# Patient Record
Sex: Male | Born: 1980 | Race: White | Hispanic: No | Smoking: Never smoker
Health system: Southern US, Community
[De-identification: ages and names within clinical notes are randomized; demographics above are authoritative.]

## PROBLEM LIST (undated history)

## (undated) DIAGNOSIS — T7840XA Allergy, unspecified, initial encounter: Secondary | ICD-10-CM

## (undated) DIAGNOSIS — Z87898 Personal history of other specified conditions: Secondary | ICD-10-CM

## (undated) DIAGNOSIS — K219 Gastro-esophageal reflux disease without esophagitis: Secondary | ICD-10-CM

## (undated) DIAGNOSIS — I499 Cardiac arrhythmia, unspecified: Secondary | ICD-10-CM

## (undated) DIAGNOSIS — J45909 Unspecified asthma, uncomplicated: Secondary | ICD-10-CM

## (undated) DIAGNOSIS — F329 Major depressive disorder, single episode, unspecified: Secondary | ICD-10-CM

## (undated) DIAGNOSIS — F32A Depression, unspecified: Secondary | ICD-10-CM

## (undated) DIAGNOSIS — F419 Anxiety disorder, unspecified: Secondary | ICD-10-CM

## (undated) DIAGNOSIS — K649 Unspecified hemorrhoids: Secondary | ICD-10-CM

## (undated) DIAGNOSIS — I340 Nonrheumatic mitral (valve) insufficiency: Secondary | ICD-10-CM

## (undated) HISTORY — DX: Anxiety disorder, unspecified: F41.9

## (undated) HISTORY — DX: Unspecified hemorrhoids: K64.9

## (undated) HISTORY — DX: Unspecified asthma, uncomplicated: J45.909

## (undated) HISTORY — DX: Allergy, unspecified, initial encounter: T78.40XA

## (undated) HISTORY — PX: UPPER GASTROINTESTINAL ENDOSCOPY: SHX188

## (undated) HISTORY — DX: Personal history of other specified conditions: Z87.898

## (undated) HISTORY — DX: Depression, unspecified: F32.A

## (undated) HISTORY — DX: Major depressive disorder, single episode, unspecified: F32.9

## (undated) HISTORY — DX: Cardiac arrhythmia, unspecified: I49.9

## (undated) HISTORY — DX: Gastro-esophageal reflux disease without esophagitis: K21.9

## (undated) HISTORY — DX: Nonrheumatic mitral (valve) insufficiency: I34.0

---

## 2006-05-13 HISTORY — PX: NASAL SINUS SURGERY: SHX719

## 2010-05-13 DIAGNOSIS — Z87898 Personal history of other specified conditions: Secondary | ICD-10-CM

## 2010-05-13 HISTORY — DX: Personal history of other specified conditions: Z87.898

## 2015-05-14 DIAGNOSIS — I340 Nonrheumatic mitral (valve) insufficiency: Secondary | ICD-10-CM

## 2015-05-14 HISTORY — DX: Nonrheumatic mitral (valve) insufficiency: I34.0

## 2018-07-01 ENCOUNTER — Encounter: Payer: Self-pay | Admitting: Physician Assistant

## 2018-07-01 ENCOUNTER — Ambulatory Visit (INDEPENDENT_AMBULATORY_CARE_PROVIDER_SITE_OTHER): Payer: BLUE CROSS/BLUE SHIELD | Admitting: Physician Assistant

## 2018-07-01 VITALS — BP 118/80 | HR 94 | Temp 98.8°F | Ht 71.0 in | Wt 174.0 lb

## 2018-07-01 DIAGNOSIS — Z87898 Personal history of other specified conditions: Secondary | ICD-10-CM

## 2018-07-01 DIAGNOSIS — Z23 Encounter for immunization: Secondary | ICD-10-CM

## 2018-07-01 DIAGNOSIS — F32A Depression, unspecified: Secondary | ICD-10-CM

## 2018-07-01 DIAGNOSIS — Z136 Encounter for screening for cardiovascular disorders: Secondary | ICD-10-CM | POA: Diagnosis not present

## 2018-07-01 DIAGNOSIS — I340 Nonrheumatic mitral (valve) insufficiency: Secondary | ICD-10-CM | POA: Diagnosis not present

## 2018-07-01 DIAGNOSIS — E559 Vitamin D deficiency, unspecified: Secondary | ICD-10-CM | POA: Diagnosis not present

## 2018-07-01 DIAGNOSIS — Z13220 Encounter for screening for lipoid disorders: Secondary | ICD-10-CM | POA: Diagnosis not present

## 2018-07-01 DIAGNOSIS — J45909 Unspecified asthma, uncomplicated: Secondary | ICD-10-CM | POA: Insufficient documentation

## 2018-07-01 DIAGNOSIS — K219 Gastro-esophageal reflux disease without esophagitis: Secondary | ICD-10-CM

## 2018-07-01 DIAGNOSIS — M2550 Pain in unspecified joint: Secondary | ICD-10-CM

## 2018-07-01 DIAGNOSIS — J4520 Mild intermittent asthma, uncomplicated: Secondary | ICD-10-CM

## 2018-07-01 DIAGNOSIS — F329 Major depressive disorder, single episode, unspecified: Secondary | ICD-10-CM

## 2018-07-01 DIAGNOSIS — Z0001 Encounter for general adult medical examination with abnormal findings: Secondary | ICD-10-CM | POA: Diagnosis not present

## 2018-07-01 LAB — CBC WITH DIFFERENTIAL/PLATELET
Basophils Absolute: 0.1 10*3/uL (ref 0.0–0.1)
Basophils Relative: 0.9 % (ref 0.0–3.0)
Eosinophils Absolute: 0.4 10*3/uL (ref 0.0–0.7)
Eosinophils Relative: 5.5 % — ABNORMAL HIGH (ref 0.0–5.0)
HCT: 48.7 % (ref 39.0–52.0)
Hemoglobin: 16.5 g/dL (ref 13.0–17.0)
LYMPHS PCT: 29.6 % (ref 12.0–46.0)
Lymphs Abs: 2.2 10*3/uL (ref 0.7–4.0)
MCHC: 33.9 g/dL (ref 30.0–36.0)
MCV: 91.5 fl (ref 78.0–100.0)
Monocytes Absolute: 0.7 10*3/uL (ref 0.1–1.0)
Monocytes Relative: 8.7 % (ref 3.0–12.0)
NEUTROS ABS: 4.2 10*3/uL (ref 1.4–7.7)
NEUTROS PCT: 55.3 % (ref 43.0–77.0)
PLATELETS: 264 10*3/uL (ref 150.0–400.0)
RBC: 5.32 Mil/uL (ref 4.22–5.81)
RDW: 13 % (ref 11.5–15.5)
WBC: 7.5 10*3/uL (ref 4.0–10.5)

## 2018-07-01 LAB — COMPREHENSIVE METABOLIC PANEL
ALT: 18 U/L (ref 0–53)
AST: 16 U/L (ref 0–37)
Albumin: 4.6 g/dL (ref 3.5–5.2)
Alkaline Phosphatase: 48 U/L (ref 39–117)
BUN: 14 mg/dL (ref 6–23)
CO2: 29 meq/L (ref 19–32)
Calcium: 9.9 mg/dL (ref 8.4–10.5)
Chloride: 103 mEq/L (ref 96–112)
Creatinine, Ser: 1.15 mg/dL (ref 0.40–1.50)
GFR: 70.62 mL/min (ref >60.00)
Glucose, Bld: 75 mg/dL (ref 70–99)
Potassium: 4.2 mEq/L (ref 3.5–5.1)
Sodium: 140 mEq/L (ref 135–145)
Total Bilirubin: 1.2 mg/dL (ref 0.2–1.2)
Total Protein: 7 g/dL (ref 6.0–8.3)

## 2018-07-01 LAB — LIPID PANEL
CHOL/HDL RATIO: 4
Cholesterol: 196 mg/dL (ref 0–200)
HDL: 54.2 mg/dL (ref >39.00)
LDL Cholesterol: 118 mg/dL — ABNORMAL HIGH (ref 0–99)
NonHDL: 141.33
Triglycerides: 118 mg/dL (ref 0.0–149.0)
VLDL: 23.6 mg/dL (ref 0.0–40.0)

## 2018-07-01 LAB — VITAMIN D 25 HYDROXY (VIT D DEFICIENCY, FRACTURES): VITD: 32.15 ng/mL (ref 30.00–100.00)

## 2018-07-01 NOTE — Patient Instructions (Signed)
It was great to see you!  Please go to the lab for blood work.   Our office will call you with your results unless you have chosen to receive results via MyChart.  If your blood work is normal we will follow-up each year for physicals and as scheduled for chronic medical problems.  If anything is abnormal we will treat accordingly and get you in for a follow-up.  Take care,  Douglas GrayerSamantha  You will be contacted about your cardiology referral.  Please make an appointment here with Dr. Berline Choughigby when you are having joint pain for further evaluation.   Health Maintenance, Male A healthy lifestyle and preventive care is important for your health and wellness. Ask your health care provider about what schedule of regular examinations is right for you. What should I know about weight and diet? Eat a Healthy Diet  Eat plenty of vegetables, fruits, whole grains, low-fat dairy products, and lean protein.  Do not eat a lot of foods high in solid fats, added sugars, or salt.  Maintain a Healthy Weight Regular exercise can help you achieve or maintain a healthy weight. You should:  Do at least 150 minutes of exercise each week. The exercise should increase your heart rate and make you sweat (moderate-intensity exercise).  Do strength-training exercises at least twice a week. Watch Your Levels of Cholesterol and Blood Lipids  Have your blood tested for lipids and cholesterol every 5 years starting at 40 years of age. If you are at high risk for heart disease, you should start having your blood tested when you are 40 years old. You may need to have your cholesterol levels checked more often if: ? Your lipid or cholesterol levels are high. ? You are older than 40 years of age. ? You are at high risk for heart disease. What should I know about cancer screening? Many types of cancers can be detected early and may often be prevented. Lung Cancer  You should be screened every year for lung cancer  if: ? You are a current smoker who has smoked for at least 30 years. ? You are a former smoker who has quit within the past 15 years.  Talk to your health care provider about your screening options, when you should start screening, and how often you should be screened. Colorectal Cancer  Routine colorectal cancer screening usually begins at 40 years of age and should be repeated every 5-10 years until you are 40 years old. You may need to be screened more often if early forms of precancerous polyps or small growths are found. Your health care provider may recommend screening at an earlier age if you have risk factors for colon cancer.  Your health care provider may recommend using home test kits to check for hidden blood in the stool.  A small camera at the end of a tube can be used to examine your colon (sigmoidoscopy or colonoscopy). This checks for the earliest forms of colorectal cancer. Prostate and Testicular Cancer  Depending on your age and overall health, your health care provider may do certain tests to screen for prostate and testicular cancer.  Talk to your health care provider about any symptoms or concerns you have about testicular or prostate cancer. Skin Cancer  Check your skin from head to toe regularly.  Tell your health care provider about any new moles or changes in moles, especially if: ? There is a change in a mole's size, shape, or color. ? You have  a mole that is larger than a pencil eraser.  Always use sunscreen. Apply sunscreen liberally and repeat throughout the day.  Protect yourself by wearing long sleeves, pants, a wide-brimmed hat, and sunglasses when outside. What should I know about heart disease, diabetes, and high blood pressure?  If you are 64-60 years of age, have your blood pressure checked every 3-5 years. If you are 4 years of age or older, have your blood pressure checked every year. You should have your blood pressure measured twice-once when  you are at a hospital or clinic, and once when you are not at a hospital or clinic. Record the average of the two measurements. To check your blood pressure when you are not at a hospital or clinic, you can use: ? An automated blood pressure machine at a pharmacy. ? A home blood pressure monitor.  Talk to your health care provider about your target blood pressure.  If you are between 27-31 years old, ask your health care provider if you should take aspirin to prevent heart disease.  Have regular diabetes screenings by checking your fasting blood sugar level. ? If you are at a normal weight and have a low risk for diabetes, have this test once every three years after the age of 17. ? If you are overweight and have a high risk for diabetes, consider being tested at a younger age or more often.  A one-time screening for abdominal aortic aneurysm (AAA) by ultrasound is recommended for men aged 65-75 years who are current or former smokers. What should I know about preventing infection? Hepatitis B If you have a higher risk for hepatitis B, you should be screened for this virus. Talk with your health care provider to find out if you are at risk for hepatitis B infection. Hepatitis C Blood testing is recommended for:  Everyone born from [birth year] through 1967.  Anyone with known risk factors for hepatitis C. Sexually Transmitted Diseases (STDs)  You should be screened each year for STDs including gonorrhea and chlamydia if: ? You are sexually active and are younger than 40 years of age. ? You are older than 40 years of age and your health care provider tells you that you are at risk for this type of infection. ? Your sexual activity has changed since you were last screened and you are at an increased risk for chlamydia or gonorrhea. Ask your health care provider if you are at risk.  Talk with your health care provider about whether you are at high risk of being infected with HIV. Your health care  provider may recommend a prescription medicine to help prevent HIV infection. What else can I do?  Schedule regular health, dental, and eye exams.  Stay current with your vaccines (immunizations).  Do not use any tobacco products, such as cigarettes, chewing tobacco, and e-cigarettes. If you need help quitting, ask your health care provider.  Limit alcohol intake to no more than 2 drinks per day. One drink equals 12 ounces of beer, 5 ounces of wine, or 1 ounces of hard liquor.  Do not use street drugs.  Do not share needles.  Ask your health care provider for help if you need support or information about quitting drugs.  Tell your health care provider if you often feel depressed.  Tell your health care provider if you have ever been abused or do not feel safe at home. This information is not intended to replace advice given to you by your health  care provider. Make sure you discuss any questions you have with your health care provider. Document Released: 10/26/2007 Document Revised: 12/27/2015 Document Reviewed: 01/31/2015 Elsevier Interactive Patient Education  2019 [employer].

## 2018-07-01 NOTE — Progress Notes (Signed)
Douglas Powell is a 38 y.o. male here to Establish Care.  I acted as a Neurosurgeonscribe for Energy East CorporationSamantha Cristin Penaflor, PA-C Corky Mullonna Orphanos, LPN  History of Present Illness:   Chief Complaint  Patient presents with  . Establish Care  . Joint Pain  . Depression    Acute Concerns: Mitral valve insufficiency // palpitations -- saw a cardiology in 2012 and 2017. Was diagnosed with "normal" arrhythmia -- I do not have these records, possibly having PVC's? He had an echo done as well because sister was diagnosed with aortic aneurysm and he was told that he should be screened. Echo a few years ago showed mitral valve insufficiency. He tells me now that he is wondering if there is something going on with his asthma vs heart as he feels like he is deconditioned -- tried to go running and feels out of shape. Denies chest pain or excessive SOB. Joint pain -- has been going on for a few years. Lasts about 3 days when he does have it. Mostly in his bilateral knees, but has been in ankles and upper extremities. When he has symptoms, he rests. He states that it does limit his activity. He tries to do pilates to see if that will help strengthen his muscles and prevent pain. Denies: unusual rashes, swelling, family or personal hx of autoimmune or joint issues. Depression -- having issues with his mood -- states that because of his intermittent joint pain he can't always work out, which is his one source of enjoyment. Denies SI/HI. Has not been on medications, denies need for medication.  Chronic Issues: Vit D deficiency -- hx of this. Currently not on any supplementation. GERD -- currently on prilosec, overall controlled Asthma -- exercise induced. Currently on advair daily. Uses proair prn.  Health Maintenance: Immunizations -- will give Flu and Tdap today. Colonoscopy -- 2018 -- normal except for internal hemorrhoids Mammogram -- N/A PAP -- N/A Bone Density -- N/A PSA -- N/A Diet -- well balanced, mostly at  home Caffeine intake -- drinks a few cups daily Sleep habits -- overall good, no concerns for sleep apnea Exercise -- as able Weight -- Weight: 174 lb (78.9 kg)  Alcohol -- rare Tobacco -- none  Depression screen [facility] 07/01/2018  Decreased Interest 0  Down, Depressed, Hopeless 1  PHQ - 2 Score 1  Altered sleeping 1  Tired, decreased energy 0  Change in appetite 0  Feeling bad or failure about yourself  1  Trouble concentrating 2  Moving slowly or fidgety/restless 0  Suicidal thoughts 0  PHQ-9 Score 5  Difficult doing work/chores Somewhat difficult    GAD 7 : Generalized Anxiety Score 07/01/2018  Nervous, Anxious, on Edge 1  Control/stop worrying 0  Worry too much - different things 1  Trouble relaxing 1  Restless 0  Easily annoyed or irritable 1  Afraid - awful might happen 0  Total GAD 7 Score 4  Anxiety Difficulty Somewhat difficult     Other providers/specialists: Patient Care Team: Jarold MottoWorley, Dreama Kuna, [place] as PCP - General (Physician Assistant)   Past Medical History:  Diagnosis Date  . Asthma   . Depression   . GERD (gastroesophageal reflux disease)   . Hemorrhoids   . History of palpitations 2012  . Mitral valve insufficiency 2017     Social History   Socioeconomic History  . Marital status: Not on file    Spouse name: Not on file  . Number of children: 4  . Years of  education: Not on file  . Highest education level: Not on file  Occupational History  . Not on file  Social Needs  . Financial resource strain: Not on file  . Food insecurity:    Worry: Not on file    Inability: Not on file  . Transportation needs:    Medical: Not on file    Non-medical: Not on file  Tobacco Use  . Smoking status: Never Smoker  . Smokeless tobacco: Never Used  Substance and Sexual Activity  . Alcohol use: Yes    Comment: rarely  . Drug use: Not Currently  . Sexual activity: Yes    Birth control/protection: None  Lifestyle  . Physical activity:    Days per  week: Not on file    Minutes per session: Not on file  . Stress: Not on file  Relationships  . Social connections:    Talks on phone: Not on file    Gets together: Not on file    Attends religious service: Not on file    Active member of club or organization: Not on file    Attends meetings of clubs or organizations: Not on file    Relationship status: Not on file  . Intimate partner violence:    Fear of current or ex partner: Not on file    Emotionally abused: Not on file    Physically abused: Not on file    Forced sexual activity: Not on file  Other Topics Concern  . Not on file  Social History Narrative   Has four children that live with him, full custody -- ages 52-17   Divorced   Engineer, structural, works from [employer]    Past Surgical History:  Procedure Laterality Date  . NASAL SINUS SURGERY  2008    Family History  Problem Relation Age of Onset  . Diabetes Father   . Heart attack Father   . Heart disease Father   . High Cholesterol Father   . Stroke Father   . Heart disease Sister   . Mental illness Brother   . Breast cancer Neg Hx   . Prostate cancer Neg Hx   . Colon cancer Neg Hx     Allergies  Allergen Reactions  . Aspirin      Current Medications:   Current Outpatient Medications:  .  fluticasone-salmeterol (ADVAIR HFA) 115-21 MCG/ACT inhaler, Inhale 2 puffs into the lungs 2 (two) times daily., Disp: , Rfl:  .  pseudoephedrine-acetaminophen (TYLENOL SINUS) 30-500 MG TABS tablet, Take 1 tablet by mouth every 4 (four) hours as needed., Disp: , Rfl:    Review of Systems:   Review of Systems  Constitutional: Negative.  Negative for chills, fever, malaise/fatigue and weight loss.  HENT: Negative.  Negative for hearing loss, sinus pain and sore throat.   Eyes: Negative.  Negative for blurred vision.  Respiratory: Negative.  Negative for cough and shortness of breath.   Cardiovascular: Negative.  Negative for chest pain, palpitations and leg swelling.   Gastrointestinal: Positive for heartburn. Negative for abdominal pain, constipation, diarrhea, nausea and vomiting.  Genitourinary: Negative.  Negative for dysuria, frequency and urgency.  Musculoskeletal: Negative for back pain, myalgias and neck pain.       Joint pain off and on would like to discuss today.  Skin: Negative.  Negative for itching and rash.  Neurological: Negative.  Negative for dizziness, tingling, seizures, loss of consciousness and headaches.  Endo/Heme/Allergies: Negative.  Negative for polydipsia.  Psychiatric/Behavioral: Negative.  Negative  for depression. The patient is not nervous/anxious.     Vitals:   Vitals:   07/01/18 1035  BP: 118/80  Pulse: 94  Temp: 98.8 F (37.1 C)  TempSrc: Oral  SpO2: 96%  Weight: 174 lb (78.9 kg)  Height: 5\' 11"  (1.803 m)      Body mass index is 24.27 kg/m.  Physical Exam:   Physical Exam Vitals signs and nursing note reviewed.  Constitutional:      General: He is not in acute distress.    Appearance: Normal appearance. He is well-developed. He is not ill-appearing or toxic-appearing.  HENT:     Head: Normocephalic and atraumatic.     Right Ear: Tympanic membrane normal. Tympanic membrane is not erythematous, retracted or bulging.     Left Ear: Tympanic membrane normal. Tympanic membrane is not erythematous, retracted or bulging.     Nose: Nose normal.     Mouth/Throat:     Pharynx: Uvula midline.  Eyes:     General: Lids are normal.     Conjunctiva/sclera: Conjunctivae normal.     Pupils: Pupils are equal, round, and reactive to light.  Neck:     Trachea: Trachea normal.  Cardiovascular:     Rate and Rhythm: Normal rate and regular rhythm.     Pulses: Normal pulses.     Heart sounds: Normal heart sounds, S1 normal and S2 normal.  Pulmonary:     Effort: Pulmonary effort is normal.     Breath sounds: Normal breath sounds.  Abdominal:     General: Bowel sounds are normal.     Tenderness: There is no abdominal  tenderness.  Lymphadenopathy:     Cervical: No cervical adenopathy.  Skin:    General: Skin is warm and dry.  Neurological:     Mental Status: He is alert.  Psychiatric:        Speech: Speech normal.        Behavior: Behavior normal. Behavior is cooperative.        Thought Content: Thought content normal.     No results found for this or any previous visit.  Assessment and Plan:   Douglas Powell was seen today for establish care, joint pain and depression.  Diagnoses and all orders for this visit:  Encounter for general adult medical examination with abnormal findings Today patient counseled on age appropriate routine health concerns for screening and prevention, each reviewed and up to date or declined. Immunizations reviewed and up to date or declined. Labs ordered and reviewed. Risk factors for depression reviewed and negative. Hearing function and visual acuity are intact. ADLs screened and addressed as needed. Functional ability and level of safety reviewed and appropriate. Education, counseling and referrals performed based on assessed risks today. Patient provided with a copy of personalized plan for preventive services.  Nonrheumatic mitral valve regurgitation; History of palpitations Referral to cardiology. -     CBC with Differential/Platelet -     Comprehensive metabolic panel  Gastroesophageal reflux disease, esophagitis presence not specified Continue prilosec. Follow-up if symptoms persist.  Depression, unspecified depression type Denies SI/HI today. Declines intervention at this time. Advised patient follow-up if symptoms worsen.  Mild intermittent asthma without complication Well-controlled presently. Continue scheduled Advair and Proair as needed.  Vitamin D deficiency -     VITAMIN D 25 Hydroxy (Vit-D Deficiency, Fractures)  Encounter for lipid screening for cardiovascular disease -     Lipid panel  Arthralgia, unspecified joint Unclear etiology of symptoms.  Discussed possible autoimmune work-up,  but he declined. He is currently asymptomatic. Will refer to Dr. Berline Choughigby.   Other orders -     Tdap vaccine greater than or equal to 7yo IM -     Flu Vaccine QUAD 6+ mos PF IM (Fluarix Quad PF)  . Reviewed expectations re: course of current medical issues. . Discussed self-management of symptoms. . Outlined signs and symptoms indicating need for more acute intervention. . Patient verbalized understanding and all questions were answered. . See orders for this visit as documented in the electronic medical record. . Patient received an After-Visit Summary.  CMA or LPN served as scribe during this visit. History, Physical, and Plan performed by medical provider. The above documentation has been reviewed and is accurate and complete.  Over 45 minutes were spent face-to-face with the patient during this encounter and >50% of that time was spent on counseling and coordination of care  Jarold MottoSamantha Sayward Horvath, PA-C

## 2018-07-10 ENCOUNTER — Encounter: Payer: Self-pay | Admitting: *Deleted

## 2018-07-13 ENCOUNTER — Other Ambulatory Visit: Payer: Self-pay | Admitting: Physician Assistant

## 2018-07-13 DIAGNOSIS — I340 Nonrheumatic mitral (valve) insufficiency: Secondary | ICD-10-CM

## 2018-07-13 DIAGNOSIS — Z87898 Personal history of other specified conditions: Secondary | ICD-10-CM

## 2018-07-14 ENCOUNTER — Ambulatory Visit (INDEPENDENT_AMBULATORY_CARE_PROVIDER_SITE_OTHER): Payer: BLUE CROSS/BLUE SHIELD | Admitting: Physician Assistant

## 2018-07-14 ENCOUNTER — Encounter: Payer: Self-pay | Admitting: Physician Assistant

## 2018-07-14 VITALS — BP 120/80 | HR 81 | Temp 98.2°F | Ht 71.0 in | Wt 173.5 lb

## 2018-07-14 DIAGNOSIS — R059 Cough, unspecified: Secondary | ICD-10-CM

## 2018-07-14 DIAGNOSIS — R05 Cough: Secondary | ICD-10-CM

## 2018-07-14 MED ORDER — AZITHROMYCIN 250 MG PO TABS: ORAL_TABLET | ORAL | 0 refills | Status: DC

## 2018-07-14 NOTE — Patient Instructions (Signed)
It was great to see you!  Start oral antibiotic.  Push fluids and get plenty of rest. Please return if you are not improving as expected, or if you have high fevers (>101.5) or difficulty swallowing or worsening productive cough.  Call clinic with questions.  I hope you start feeling better soon!

## 2018-07-14 NOTE — Progress Notes (Signed)
Douglas Powell is a 38 y.o. male here for a new problem.  I acted as a Neurosurgeon for [employer], PA-C Corky Mull, LPN  History of Present Illness:   Chief Complaint  Patient presents with  . Cough    Cough  This is a new problem. Episode onset: Started few weeks ago. The problem has been waxing and waning. The problem occurs constantly. The cough is productive of sputum (expectorating green sputum and now today coughed up some blood). Associated symptoms include chills (over the weekend), nasal congestion (green drainage), postnasal drip and a sore throat (over the weekend). Pertinent negatives include no ear congestion, ear pain, fever, headaches or shortness of breath. Nothing aggravates the symptoms. He has tried nothing for the symptoms. His past medical history is significant for asthma. There is no history of bronchitis or pneumonia.   Kids have been sick with cough at home.  Past Medical History:  Diagnosis Date  . Asthma   . Depression   . GERD (gastroesophageal reflux disease)   . Hemorrhoids   . History of palpitations 2012  . Mitral valve insufficiency 2017     Social History   Socioeconomic History  . Marital status: Divorced    Spouse name: Not on file  . Number of children: 4  . Years of education: Not on file  . Highest education level: Not on file  Occupational History  . Not on file  Social Needs  . Financial resource strain: Not on file  . Food insecurity:    Worry: Not on file    Inability: Not on file  . Transportation needs:    Medical: Not on file    Non-medical: Not on file  Tobacco Use  . Smoking status: Never Smoker  . Smokeless tobacco: Never Used  Substance and Sexual Activity  . Alcohol use: Yes    Comment: rarely  . Drug use: Not Currently  . Sexual activity: Yes    Birth control/protection: None  Lifestyle  . Physical activity:    Days per week: Not on file    Minutes per session: Not on file  . Stress: Not on file   Relationships  . Social connections:    Talks on phone: Not on file    Gets together: Not on file    Attends religious service: Not on file    Active member of club or organization: Not on file    Attends meetings of clubs or organizations: Not on file    Relationship status: Not on file  . Intimate partner violence:    Fear of current or ex partner: Not on file    Emotionally abused: Not on file    Physically abused: Not on file    Forced sexual activity: Not on file  Other Topics Concern  . Not on file  Social History Narrative   Has four children that live with him, full custody -- ages 15-17   Divorced   Engineer, structural, works from [employer]    Past Surgical History:  Procedure Laterality Date  . NASAL SINUS SURGERY  2008    Family History  Problem Relation Age of Onset  . Diabetes Father   . Heart attack Father   . Heart disease Father   . High Cholesterol Father   . Stroke Father   . Heart disease Sister   . Mental illness Brother   . Breast cancer Neg Hx   . Prostate cancer Neg Hx   . Colon cancer  Neg Hx     Allergies  Allergen Reactions  . Aspirin Other (See Comments)    Pt can not have more than 325 mg a day.    Current Medications:   Current Outpatient Medications:  .  albuterol (PROVENTIL HFA;VENTOLIN HFA) 108 (90 Base) MCG/ACT inhaler, Inhale 2 puffs into the lungs as needed. , Disp: , Rfl:  .  aspirin 325 MG tablet, Take 325 mg by mouth daily., Disp: , Rfl:  .  fluticasone-salmeterol (ADVAIR HFA) 115-21 MCG/ACT inhaler, Inhale 2 puffs into the lungs 2 (two) times daily., Disp: , Rfl:  .  azithromycin (ZITHROMAX) 250 MG tablet, Take two tablets on day 1, then one tablet daily x 4 days, Disp: 6 tablet, Rfl: 0   Review of Systems:   Review of Systems  Constitutional: Positive for chills (over the weekend). Negative for fever.  HENT: Positive for postnasal drip and sore throat (over the weekend). Negative for ear pain.   Respiratory: Positive for cough.  Negative for shortness of breath.   Neurological: Negative for headaches.    Vitals:   Vitals:   07/14/18 1031  BP: 120/80  Pulse: 81  Temp: 98.2 F (36.8 C)  TempSrc: Oral  SpO2: 97%  Weight: 173 lb 8 oz (78.7 kg)  Height:  (1.803 m)     Body mass index is 24.2 kg/m.  Physical Exam:   Physical Exam Vitals signs and nursing note reviewed.  Constitutional:      General: He is not in acute distress.    Appearance: He is well-developed. He is not ill-appearing or toxic-appearing.  HENT:     Head: Normocephalic and atraumatic.     Right Ear: Tympanic membrane, ear canal and external ear normal. Tympanic membrane is not erythematous, retracted or bulging.     Left Ear: Tympanic membrane, ear canal and external ear normal. Tympanic membrane is not erythematous, retracted or bulging.     Nose: Mucosal edema, congestion and rhinorrhea present. No nasal deformity.     Right Sinus: No maxillary sinus tenderness or frontal sinus tenderness.     Left Sinus: No maxillary sinus tenderness or frontal sinus tenderness.     Mouth/Throat:     Lips: Pink.     Mouth: Mucous membranes are moist.     Pharynx: Uvula midline. Posterior oropharyngeal erythema present.     Tonsils: No tonsillar exudate. Swelling: 0 on the right. 0 on the left.  Eyes:     General: Lids are normal.     Conjunctiva/sclera: Conjunctivae normal.  Neck:     Trachea: Trachea normal.  Cardiovascular:     Rate and Rhythm: Normal rate and regular rhythm.     Heart sounds: Normal heart sounds, S1 normal and S2 normal.  Pulmonary:     Effort: Pulmonary effort is normal.     Breath sounds: Normal breath sounds. No decreased breath sounds, wheezing, rhonchi or rales.  Lymphadenopathy:     Cervical: No cervical adenopathy.  Skin:    General: Skin is warm and dry.  Neurological:     Mental Status: He is alert.  Psychiatric:        Speech: Speech normal.        Behavior: Behavior normal. Behavior is cooperative.        Assessment and Plan:   [name] was seen today for cough.  Diagnoses and all orders for this visit:  Cough  Other orders -     azithromycin (ZITHROMAX) 250 MG tablet; Take  two tablets on day 1, then one tablet daily x 4 days   No red flags on exam. Suspect bronchitis. Will initiate azithromycin per orders. Offered prednisone, but he declined. Discussed taking medications as prescribed. Reviewed return precautions including worsening fever, SOB, worsening cough or other concerns. Push fluids and rest. I recommend that patient follow-up if symptoms worsen or persist despite treatment x 7-10 days, sooner if needed.  . Reviewed expectations re: course of current medical issues. . Discussed self-management of symptoms. . Outlined signs and symptoms indicating need for more acute intervention. . Patient verbalized understanding and all questions were answered. . See orders for this visit as documented in the electronic medical record. . Patient received an After-Visit Summary.  CMA or LPN served as scribe during this visit. History, Physical, and Plan performed by medical provider. The above documentation has been reviewed and is accurate and complete.   Jarold Motto, PA-C

## 2018-07-28 ENCOUNTER — Other Ambulatory Visit: Payer: Self-pay | Admitting: Physician Assistant

## 2018-07-28 ENCOUNTER — Encounter: Payer: Self-pay | Admitting: Physician Assistant

## 2018-07-31 ENCOUNTER — Telehealth: Payer: Self-pay | Admitting: Cardiology

## 2018-07-31 ENCOUNTER — Other Ambulatory Visit: Payer: Self-pay | Admitting: Physician Assistant

## 2018-07-31 NOTE — Telephone Encounter (Signed)
LVM for patient to call office to r/s appt with Dr. Higgins Lions as there has been a change in his schedule.

## 2018-08-03 ENCOUNTER — Other Ambulatory Visit: Payer: Self-pay | Admitting: Physician Assistant

## 2018-08-03 MED ORDER — BUDESONIDE 32 MCG/ACT NA SUSP: 2.0000 | Freq: Two times a day (BID) | NASAL | 3 refills | Status: DC

## 2018-08-04 ENCOUNTER — Ambulatory Visit: Payer: BLUE CROSS/BLUE SHIELD | Admitting: Cardiology

## 2018-08-04 ENCOUNTER — Telehealth: Payer: Self-pay | Admitting: Cardiology

## 2018-08-04 NOTE — Telephone Encounter (Signed)
I tried to call the patient to discuss his symptoms and to schedule either for an in office visit if needed or a telehealth visit.  LMTCB.  We will try again to contact.  He also has our information to call us.

## 2018-08-31 ENCOUNTER — Telehealth: Payer: Self-pay

## 2018-08-31 NOTE — Telephone Encounter (Signed)
Virtual Visit Pre-Appointment Phone Call  Steps For Call:  1. Confirm consent - "In the setting of the current Covid19 crisis, you are scheduled for a (phone or video) visit with your provider on (09/04/18) at (1PM).  Just as we do with many in-office visits, in order for you to participate in this visit, we must obtain consent.  If you'd like, I can send this to your mychart (if signed up) or email for you to review.  Otherwise, I can obtain your verbal consent now.  All virtual visits are billed to your insurance company just like a normal visit would be.  By agreeing to a virtual visit, we'd like you to understand that the technology does not allow for your provider to perform an examination, and thus may limit your provider's ability to fully assess your condition. If your provider identifies any concerns that need to be evaluated in person, we will make arrangements to do so.  Finally, though the technology is pretty good, we cannot assure that it will always work on either your or our end, and in the setting of a video visit, we may have to convert it to a phone-only visit.  In either situation, we cannot ensure that we have a secure connection.  Are you willing to proceed?" STAFF: Did the patient verbally acknowledge consent to telehealth visit? Document YES/NO here: YES  2. Confirm the BEST phone number to call the day of the visit by including in appointment notes  3. Give patient instructions for WebEx/MyChart download to smartphone as below or Doximity/Doxy.me if video visit (depending on what platform provider is using)  4. Advise patient to be prepared with their blood pressure, heart rate, weight, any heart rhythm information, their current medicines, and a piece of paper and pen handy for any instructions they may receive the day of their visit  5. Inform patient they will receive a phone call 15 minutes prior to their appointment time (may be from unknown caller ID) so they should be  prepared to answer  6. Confirm that appointment type is correct in Epic appointment notes (VIDEO vs PHONE)     TELEPHONE CALL NOTE  Douglas Powell has been deemed a candidate for a follow-up tele-health visit to limit community exposure during the Covid-19 pandemic. I spoke with the patient via phone to ensure availability of phone/video source, confirm preferred email & phone number, and discuss instructions and expectations.  I reminded Douglas Powell to be prepared with any vital sign and/or heart rhythm information that could potentially be obtained via home monitoring, at the time of his visit. I reminded Douglas Powell to expect a phone call at the time of his visit if his visit.  [name], CMA 08/31/2018 10:21 AM   INSTRUCTIONS FOR DOWNLOADING THE WEBEX APP TO SMARTPHONE  - If Apple, ask patient to go to [employer] and type in WebEx in the search bar. Download [employer] Meetings, the blue/green circle. If Android, go to [employer] Play Store and type in [employer]Ex in the search bar. The app is free but as with any other app downloads, their phone may require them to verify saved payment information or Apple/Android password.  - The patient does NOT have to create an account. - On the day of the visit, the assist will walk the patient through joining the meeting with the meeting number/password.  INSTRUCTIONS FOR DOWNLOADING THE MYCHART APP TO SMARTPHONE  - The patient must first make sure to  have activated MyChart and know their login information - If Apple, go to [employer] and type in MyChart in the search bar and download the app. If Android, ask patient to go to [employer] and type in [place] in the search bar and download the app. The app is free but as with any other app downloads, their phone may require them to verify saved payment information or Apple/Android password.  - The patient will need to then log into the app with their MyChart username and password, and  select [facility] as their healthcare provider to link the account. When it is time for your visit, go to the MyChart app, find appointments, and click Begin Video Visit. Be sure to Select Allow for your device to access the Microphone and Camera for your visit. You will then be connected, and your provider will be with you shortly.  **If they have any issues connecting, or need assistance please contact MyChart service desk [phone])**  **If using a computer, in order to ensure the best quality for their visit they will need to use either of the following Internet Browsers: [employer], or Google Chrome**  IF USING DOXIMITY or DOXY.ME - The patient will receive a link just prior to their visit, either by text or email (to be determined day of appointment depending on if it's doxy.me or Doximity).     FULL LENGTH CONSENT FOR TELE-HEALTH VISIT   I hereby voluntarily request, consent and authorize [facility] and its employed or contracted physicians, physician assistants, nurse practitioners or other licensed health care professionals (the Practitioner), to provide me with telemedicine health care services (the "Services") as deemed necessary by the treating Practitioner. I acknowledge and consent to receive the Services by the Practitioner via telemedicine. I understand that the telemedicine visit will involve communicating with the Practitioner through live audiovisual communication technology and the disclosure of certain medical information by electronic transmission. I acknowledge that I have been given the opportunity to request an in-person assessment or other available alternative prior to the telemedicine visit and am voluntarily participating in the telemedicine visit.  I understand that I have the right to withhold or withdraw my consent to the use of telemedicine in the course of my care at any time, without affecting my right to future care or treatment, and that  the Practitioner or I may terminate the telemedicine visit at any time. I understand that I have the right to inspect all information obtained and/or recorded in the course of the telemedicine visit and may receive copies of available information for a reasonable fee.  I understand that some of the potential risks of receiving the Services via telemedicine include:  Marland Kitchen Delay or interruption in medical evaluation due to technological equipment failure or disruption; . Information transmitted may not be sufficient (e.g. poor resolution of images) to allow for appropriate medical decision making by the Practitioner; and/or  . In rare instances, security protocols could fail, causing a breach of personal health information.  Furthermore, I acknowledge that it is my responsibility to provide information about my medical history, conditions and care that is complete and accurate to the best of my ability. I acknowledge that Practitioner's advice, recommendations, and/or decision may be based on factors not within their control, such as incomplete or inaccurate data provided by me or distortions of diagnostic images or specimens that may result from electronic transmissions. I understand that the practice of medicine is not an exact science and  that Practitioner makes no warranties or guarantees regarding treatment outcomes. I acknowledge that I will receive a copy of this consent concurrently upon execution via email to the email address I last provided but may also request a printed copy by calling the office of [facility].    I understand that my insurance will be billed for this visit.   I have read or had this consent read to me. . I understand the contents of this consent, which adequately explains the benefits and risks of the Services being provided via telemedicine.  . I have been provided ample opportunity to ask questions regarding this consent and the Services and have had my questions answered to  my satisfaction. . I give my informed consent for the services to be provided through the use of telemedicine in my medical care  By participating in this telemedicine visit I agree to the above.

## 2018-09-03 NOTE — Progress Notes (Signed)
Virtual Visit via Video Note   This visit type was conducted due to national recommendations for restrictions regarding the COVID-19 Pandemic (e.g. social distancing) in an effort to limit this patient's exposure and mitigate transmission in our community.  Due to his co-morbid illnesses, this patient is at least at moderate risk for complications without adequate follow up.  This format is felt to be most appropriate for this patient at this time.  All issues noted in this document were discussed and addressed.  A limited physical exam was performed with this format.  Please refer to the patient's chart for his consent to telehealth for [facility].   Evaluation Performed:  Follow-up visit  Date:  09/04/2018   ID:  Douglas Powell, DOB [birth year], MRN [id number]  Patient Location: Home Provider Location: Home  PCP:  Jarold MottoWorley, [name], PA  Cardiologist:  Rollene RotundaJames Dessa Ledee, MD  Electrophysiologist:  None   Chief Complaint:  PALPITATIONS  History of Present Illness:    Douglas MerlRichard Mangione is a [age] y.o. male with the patient presents for evaluation of palpitations.  He had previously seen cardiology in 2012 and 17.  He had an echo several years ago that apparently demonstrated mitral insufficiency.  I did not have these results in our system.  He said he was evaluated years ago because his sister who was about 40 had an aorta of about 40 mm.  [place] in [place].  She is never had any diagnosis and she reports that this is been stable for years.  Because of this he had an echo and I do not have these results as above.  He was told that his valve was not significant and really did not need follow-up.  He has not had any chest pressure, neck or arm discomfort.  He does not have any PND or orthopnea.  He tries to exercise routinely and does is typically but feels like he gets short of breath with significant exertion like swimming a distance or walking or biking  a distance.  He thinks he has a little more limitation than he should but he seems to have a very good exercise tolerance overall.  He does have palpitations and these are sporadic.  He is learned to live with him.  He does not describe sustained tachyarrhythmias.  He is not having any presyncope or syncope.  He has no chest pressure, neck or arm discomfort.  He has had no weight gain or edema.  The patient does not have symptoms concerning for COVID-19 infection (fever, chills, cough, or new shortness of breath).    Past Medical History:  Diagnosis Date  . Asthma   . Depression   . GERD (gastroesophageal reflux disease)   . Hemorrhoids   . History of palpitations 2012  . Mitral valve insufficiency 2017   Past Surgical History:  Procedure Laterality Date  . NASAL SINUS SURGERY  2008     Prior to Admission medications   Medication Sig Start Date End Date Taking? Authorizing Provider  albuterol (PROVENTIL HFA;VENTOLIN HFA) 108 (90 Base) MCG/ACT inhaler Inhale 2 puffs into the lungs as needed.  05/15/18   [provider]  aspirin 325 MG tablet Take 325 mg by mouth daily.    [provider]  azithromycin (ZITHROMAX) 250 MG tablet Take two tablets on day 1, then one tablet daily x 4 days 07/14/18   Jarold MottoWorley, [name], PA  budesonide (RHINOCORT ALLERGY) 32 MCG/ACT nasal spray Place 2 sprays into  both nostrils 2 (two) times daily. 08/03/18   Jarold Motto, PA  fluticasone-salmeterol (ADVAIR HFA) [id number] MCG/ACT inhaler Inhale 2 puffs into the lungs 2 (two) times daily.    [provider]    Allergies:   Aspirin   Social History   Tobacco Use  . Smoking status: Never Smoker  . Smokeless tobacco: Never Used  Substance Use Topics  . Alcohol use: Yes    Comment: rarely  . Drug use: Not Currently     Family Hx: The patient's family history includes Diabetes in his father; Heart disease in his sister; High Cholesterol in his father; Mental illness in his brother;  Stroke in his father. There is no history of Breast cancer, Prostate cancer, or Colon cancer.  ROS:   Please see the history of present illness.    As stated in the HPI and negative for all other systems.   Prior CV studies:   The following studies were reviewed today:    Labs/Other Tests and Data Reviewed:    EKG:  No ECG reviewed.  Recent Labs: 07/01/2018: ALT 18; BUN 14; Creatinine, Ser 1.15; Hemoglobin 16.5; Platelets 264.0; Potassium 4.2; Sodium 140   Recent Lipid Panel Lab Results  Component Value Date/Time   CHOL 196 07/01/2018 11:26 AM   TRIG 118.0 07/01/2018 11:26 AM   HDL 54.20 07/01/2018 11:26 AM   CHOLHDL 4 07/01/2018 11:26 AM   LDLCALC 118 (H) 07/01/2018 11:26 AM    Wt Readings from Last 3 Encounters:  09/04/18 170 lb (77.1 kg)  07/14/18 173 lb 8 oz (78.7 kg)  07/01/18 174 lb (78.9 kg)     Objective:    Vital Signs:  Ht 6' (1.829 m)   Wt 170 lb (77.1 kg)   BMI 23.06 kg/m    VITAL SIGNS:  reviewed GEN:  no acute distress EYES:  sclerae anicteric, EOMI - Extraocular Movements Intact NEURO:  alert and oriented x 3, no obvious focal deficit PSYCH:  normal affect  ASSESSMENT & PLAN:    PALPITATIONS: The patient has a stable pattern of palpitations.  We had a long discussion about this.  I do not think further change in management or evaluation is warranted.  MITRAL REGURGITATION: I am going to get a copy of the old echo report.  Depending on what this looked like I decide on further evaluation with physical exam plus or minus another echo.  I suspect this was mild as he was told no further work-up was needed.  SOB: Patient describes some mild dyspnea with significant exertion but I would suspect this is not related to structural heart disease.  He is going to continue to work on a slightly increasing exercise regimen and if he gets more short of breath rather than less he would let me know and I might consider a cardiopulmonary stress test.    COVID-19  Education: The signs and symptoms of COVID-19 were discussed with the patient and how to seek care for testing (follow up with PCP or arrange E-visit).  The importance of social distancing was discussed today.  Time:   Today, I have spent 25 minutes with the patient with telehealth technology discussing the above problems.     Medication Adjustments/Labs and Tests Ordered: Current medicines are reviewed at length with the patient today.  Concerns regarding medicines are outlined above.   Tests Ordered: No orders of the defined types were placed in this encounter.   Medication Changes: No orders of the defined types were  placed in this encounter.   Disposition:  Follow up prn  Signed, Rollene Rotunda, MD  09/04/2018 4:55 PM    [facility]

## 2018-09-04 ENCOUNTER — Encounter: Payer: Self-pay | Admitting: Cardiology

## 2018-09-04 ENCOUNTER — Telehealth: Payer: Self-pay | Admitting: Cardiology

## 2018-09-04 ENCOUNTER — Telehealth (INDEPENDENT_AMBULATORY_CARE_PROVIDER_SITE_OTHER): Payer: BLUE CROSS/BLUE SHIELD | Admitting: Cardiology

## 2018-09-04 VITALS — Ht 72.0 in | Wt 170.0 lb

## 2018-09-04 DIAGNOSIS — I340 Nonrheumatic mitral (valve) insufficiency: Secondary | ICD-10-CM

## 2018-09-04 DIAGNOSIS — R0602 Shortness of breath: Secondary | ICD-10-CM | POA: Insufficient documentation

## 2018-09-04 DIAGNOSIS — R002 Palpitations: Secondary | ICD-10-CM | POA: Diagnosis not present

## 2018-09-04 NOTE — Patient Instructions (Signed)

## 2018-09-04 NOTE — Telephone Encounter (Signed)
F/U Message          Patient is returning a call for virtual visit link pls call to advise. [phone]

## 2018-10-06 ENCOUNTER — Encounter: Payer: Self-pay | Admitting: Physician Assistant

## 2018-10-07 ENCOUNTER — Other Ambulatory Visit: Payer: Self-pay | Admitting: Physician Assistant

## 2018-10-07 DIAGNOSIS — L821 Other seborrheic keratosis: Secondary | ICD-10-CM

## 2018-12-15 ENCOUNTER — Other Ambulatory Visit: Payer: Self-pay | Admitting: Physician Assistant

## 2019-03-06 ENCOUNTER — Other Ambulatory Visit: Payer: Self-pay | Admitting: Physician Assistant

## 2019-07-07 ENCOUNTER — Other Ambulatory Visit: Payer: Self-pay

## 2019-07-07 ENCOUNTER — Ambulatory Visit (INDEPENDENT_AMBULATORY_CARE_PROVIDER_SITE_OTHER): Payer: Managed Care, Other (non HMO) | Admitting: Physician Assistant

## 2019-07-07 ENCOUNTER — Encounter: Payer: Self-pay | Admitting: Physician Assistant

## 2019-07-07 VITALS — BP 120/80 | HR 67 | Temp 98.1°F | Ht 72.0 in | Wt 175.4 lb

## 2019-07-07 DIAGNOSIS — R1031 Right lower quadrant pain: Secondary | ICD-10-CM

## 2019-07-07 DIAGNOSIS — M25561 Pain in right knee: Secondary | ICD-10-CM

## 2019-07-07 DIAGNOSIS — M79645 Pain in left finger(s): Secondary | ICD-10-CM

## 2019-07-07 DIAGNOSIS — M25562 Pain in left knee: Secondary | ICD-10-CM | POA: Diagnosis not present

## 2019-07-07 DIAGNOSIS — G8929 Other chronic pain: Secondary | ICD-10-CM

## 2019-07-07 DIAGNOSIS — M79644 Pain in right finger(s): Secondary | ICD-10-CM

## 2019-07-07 NOTE — Progress Notes (Signed)
Douglas Powell is a 39 y.o. male here for a new problem.  I acted as a Neurosurgeon for [employer], PA-C Corky Mull, LPN  History of Present Illness:   Chief Complaint  Patient presents with  . Joint Pain    HPI   Joint pain Pt c/o joint pain numerous joints over the past 3 months.  Symptoms started in his right groin.  He states that the area feels tight and then feels like it needs to pop, even if he is able to pop it still has lingering symptoms.  Does not feel a bulge in his groin, and denies any inciting event.  He had 1 incident of left sciatica pain radiates down leg.  He also is experiencing intermittent, bilateral sharp shooting pain at the top of his knees.  He also endorses joint pain at the base of bilateral thumbs.  He spends his time working from home in a chair and also takes care of his 4 children.  He currently is not doing any scheduled exercise.  Denies any family history of rheumatoid arthritis and symptoms are not severe enough for him to take any medications.   Past Medical History:  Diagnosis Date  . Asthma   . Depression   . GERD (gastroesophageal reflux disease)   . Hemorrhoids   . History of palpitations 2012  . Mitral valve insufficiency 2017     Social History   Socioeconomic History  . Marital status: Divorced    Spouse name: Not on file  . Number of children: 4  . Years of education: Not on file  . Highest education level: Not on file  Occupational History  . Not on file  Tobacco Use  . Smoking status: Never Smoker  . Smokeless tobacco: Never Used  Substance and Sexual Activity  . Alcohol use: Yes    Comment: rarely  . Drug use: Not Currently  . Sexual activity: Yes    Birth control/protection: None  Other Topics Concern  . Not on file  Social History Narrative   Has four children that live with him, full custody -- ages 49 -66   Engineer, structural, works from home   Social Determinants of Corporate investment banker Strain:    . Difficulty of Paying Living Expenses: Not on file  Food Insecurity:   . Worried About Programme researcher, broadcasting/film/video in the Last Year: Not on file  . Ran Out of Food in the Last Year: Not on file  Transportation Needs:   . Lack of Transportation (Medical): Not on file  . Lack of Transportation (Non-Medical): Not on file  Physical Activity:   . Days of Exercise per Week: Not on file  . Minutes of Exercise per Session: Not on file  Stress:   . Feeling of Stress : Not on file  Social Connections:   . Frequency of Communication with Friends and Family: Not on file  . Frequency of Social Gatherings with Friends and Family: Not on file  . Attends Religious Services: Not on file  . Active Member of Clubs or Organizations: Not on file  . Attends Banker Meetings: Not on file  . Marital Status: Not on file  Intimate Partner Violence:   . Fear of Current or Ex-Partner: Not on file  . Emotionally Abused: Not on file  . Physically Abused: Not on file  . Sexually Abused: Not on file    Past Surgical History:  Procedure Laterality Date  . NASAL SINUS  SURGERY  2008    Family History  Problem Relation Age of Onset  . Diabetes Father   . High Cholesterol Father   . Stroke Father   . Heart disease Sister        Enlarged aorta  . Mental illness Brother   . Breast cancer Neg Hx   . Prostate cancer Neg Hx   . Colon cancer Neg Hx     Allergies  Allergen Reactions  . Aspirin Other (See Comments)    Pt can not have more than 325 mg a day.    Current Medications:   Current Outpatient Medications:  .  albuterol (PROVENTIL HFA;VENTOLIN HFA) 108 (90 Base) MCG/ACT inhaler, Inhale 2 puffs into the lungs as needed. , Disp: , Rfl:  .  aspirin 325 MG tablet, Take 325 mg by mouth daily., Disp: , Rfl:  .  [employer] BUDESONIDE 32 MCG/ACT nasal spray, PLACE 2 SPRAYS INTO BOTH NOSTRILS 2 (TWO) TIMES DAILY, Disp: 8.43 mL, Rfl: 1 .  fluticasone-salmeterol (ADVAIR HFA) 115-21 MCG/ACT inhaler, Inhale 2  puffs into the lungs 2 (two) times daily., Disp: , Rfl:  .  omeprazole (PRILOSEC OTC) 20 MG tablet, Take 20 mg by mouth as needed., Disp: , Rfl:    Review of Systems:   ROS  Negative unless otherwise specified per HPI.  Vitals:   Vitals:   07/07/19 0854  BP: 120/80  Pulse: 67  Temp: 98.1 F (36.7 C)  TempSrc: Temporal  SpO2: 98%  Weight: 175 lb 6.1 oz (79.6 kg)  Height: 6' (1.829 m)     Body mass index is 23.79 kg/m.  Physical Exam:   Physical Exam Vitals and nursing note reviewed.  Constitutional:      Appearance: He is well-developed.  HENT:     Head: Normocephalic.  Eyes:     Conjunctiva/sclera: Conjunctivae normal.     Pupils: Pupils are equal, round, and reactive to light.  Pulmonary:     Effort: Pulmonary effort is normal.  Musculoskeletal:        General: Normal range of motion.     Cervical back: Normal range of motion.     Comments: Bilateral thumbs: No obvious swelling or deformity.  Normal range of motion and opposition.  Does have positive Finkelstein's on right.  Bilateral knees: No obvious swelling or deformity.  Normal range of motion.  No tenderness to palpation or reproduction of symptoms today.  Right groin: No obvious swelling or deformity.  Normal range of motion. No tenderness to palpation or reproduction of symptoms today.  Skin:    General: Skin is warm and dry.  Neurological:     Mental Status: He is alert and oriented to person, place, and time.  Psychiatric:        Behavior: Behavior normal.        Thought Content: Thought content normal.        Judgment: Judgment normal.     Assessment and Plan:   Douglas Powell was seen today for joint pain.  Diagnoses and all orders for this visit:  Right groin pain Unclear etiology. Differential includes hip flexor strain vs hip impingement vs iliopsoas strain/tendinopathy vs other.  No red flags on exam but will refer to sports medicine for further evaluation and management. -     Ambulatory  referral to Sports Medicine  Chronic pain of both knees Unclear etiology.  No red flags on exam but will refer to sports medicine for further evaluation and management. -  Ambulatory referral to Sports Medicine  Bilateral thumb pain I do suspect that he has some level of CMC arthritis bilaterally, with also possible de Quervain's tenosynovitis and right hand.  Discussed overall care for this including rest, ice, splinting as needed.  May need to go to sports medicine at some point if this is becoming debilitating or bothersome for patient.  . Reviewed expectations re: course of current medical issues. . Discussed self-management of symptoms. . Outlined signs and symptoms indicating need for more acute intervention. . Patient verbalized understanding and all questions were answered. . See orders for this visit as documented in the electronic medical record. . Patient received an After-Visit Summary.  CMA or LPN served as scribe during this visit. History, Physical, and Plan performed by medical provider. The above documentation has been reviewed and is accurate and complete.  Jarold Motto, PA-C

## 2019-07-07 NOTE — Patient Instructions (Signed)
It was great to see you!  A referral has been placed for you to see Dr. Evan Corey with [facility]. Someone from there office will be in touch soon regarding your appointment with him. His location: [facility] [place] [address] on the 1st floor.   Phone number [phone], Fax [phone].  This location is across the street from the entrance to [facility] and in the same complex as the [facility] and [facility]   Take care,  Texanna Hilburn PA-C  

## 2019-07-15 ENCOUNTER — Ambulatory Visit (INDEPENDENT_AMBULATORY_CARE_PROVIDER_SITE_OTHER): Payer: Managed Care, Other (non HMO)

## 2019-07-15 ENCOUNTER — Ambulatory Visit: Payer: Managed Care, Other (non HMO) | Admitting: Family Medicine

## 2019-07-15 ENCOUNTER — Other Ambulatory Visit: Payer: Self-pay

## 2019-07-15 ENCOUNTER — Encounter: Payer: Self-pay | Admitting: Family Medicine

## 2019-07-15 ENCOUNTER — Ambulatory Visit: Payer: Self-pay

## 2019-07-15 VITALS — BP 112/70 | HR 75 | Ht 72.0 in | Wt 175.2 lb

## 2019-07-15 DIAGNOSIS — M7041 Prepatellar bursitis, right knee: Secondary | ICD-10-CM | POA: Insufficient documentation

## 2019-07-15 DIAGNOSIS — M25561 Pain in right knee: Secondary | ICD-10-CM | POA: Diagnosis not present

## 2019-07-15 DIAGNOSIS — M654 Radial styloid tenosynovitis [de Quervain]: Secondary | ICD-10-CM

## 2019-07-15 DIAGNOSIS — M25551 Pain in right hip: Secondary | ICD-10-CM | POA: Diagnosis not present

## 2019-07-15 DIAGNOSIS — G8929 Other chronic pain: Secondary | ICD-10-CM

## 2019-07-15 DIAGNOSIS — M7042 Prepatellar bursitis, left knee: Secondary | ICD-10-CM

## 2019-07-15 DIAGNOSIS — M25562 Pain in left knee: Secondary | ICD-10-CM

## 2019-07-15 NOTE — Patient Instructions (Addendum)
Thank you for coming in today. 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  Plan for home exercise.  If not better let me know and I will arrange for MRI arthrogram.   For knee I think the problem is prepatellar bursitis.  Use voltaren gel up to 4x daily.  Use compression during activity.  Continue eccentric exercises.   For wrist I think it is dequervans tenosynovitis.  Do home exercises.  Use voltaren gel.   Please perform the exercise program that we have prepared for you and gone over in detail on a daily basis.  In addition to the handout you were provided you can access your program through: [email or web address]   Your unique program code is:  [email or web address] and [email or web address]

## 2019-07-15 NOTE — Progress Notes (Signed)
Subjective:    I'm seeing this patient as a consultation for:  Len Blalock, [place]. Note will be routed back to referring provider/PCP.  CC: B knee pain, R groin and B thumb pain  I, Molly Weber, LAT, ATC, am serving as scribe for Dr. Lynne Leader.  HPI: Pt is a 39 y/o male presenting w/ multiple joint pain c/o including B knees, R groin and B thumbs.  Pt states that his issues began w/ R groin pain x few months, reporting that his R groin feels tight like it needs to pop.  He denies any specific MOI.  Aggravating factors include hip ER and IR ROM.  B knee pain: He reports B sharp, shooting pain at the top of his knees that is random in nature. -Swelling: No -Mechanical symptoms: Yes but nothing new -Aggravating factors: Nothing specific but does report increased knee pain w/ increased activity. Location located at superior knees near patellar tendon insertion.  B thumb pain: He reports B thumb pain at the base of both thumbs x one month but notes that this has improved.  Reports increased pain w/ a Finkelstein's test position. -Swelling: No -Mechanical symptoms: No Symptoms are improving on their own.  Diagnostic imaging: None  Past medical history, Surgical history, Family history, Social history, Allergies, and medications have been entered into the medical record, reviewed.   Review of Systems: No new headache, visual changes, nausea, vomiting, diarrhea, constipation, dizziness, abdominal pain, skin rash, fevers, chills, night sweats, weight loss, swollen lymph nodes, body aches, joint swelling, muscle aches, chest pain, shortness of breath, mood changes, visual or auditory hallucinations.   Objective:    Vitals:   07/15/19 1011  BP: 112/70  Pulse: 75  SpO2: 98%   General: Well Developed, well nourished, and in no acute distress.  Neuro/Psych: Alert and oriented x3, extra-ocular muscles intact, able to move all 4 extremities, sensation grossly intact. Skin: Warm and dry, no  rashes noted.  Respiratory: Not using accessory muscles, speaking in full sentences, trachea midline.  Cardiovascular: Pulses palpable, no extremity edema. Abdomen: Does not appear distended. MSK:  Right hip: Normal-appearing. Range of motion: Normal flexion.  Significantly limited internal rotation. Normal external rotation. Nontender. Reproduce pain with flexion and internal rotation.  Left hip: Normal-appearing.  Normal flexion. Internal rotation also limited however approximately 5 to 10 degrees more than right. Normal external rotation. Nontender.  Right knee: Normal-appearing no swelling or deformity. Nontender. Full range of motion. Stable ligamentous exam. Intact strength.  Left knee: Normal-appearing no swelling or deformity. Nontender. Full range of motion. Stable ligaments exam. Intact strength.  Hands and wrist bilaterally normal-appearing nontender normal motion.  Minimally positive Finkelstein's test bilaterally. Normal strength.  Pulses cap refill and sensation are intact.  Lab and Radiology Results  Diagnostic Limited MSK Ultrasound of: Bilateral knees Quad tendon intact normal-appearing no significant joint effusion. Patellar tendon intact.  Slight hypoechoic fluid tracking superficial to patellar tendon both right and left knee. Normal medial lateral joint lines both sides. 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  Cam type femoral stem impingement pattern present.  No significant degenerative changes. Await formal radiology review   Impression and Recommendations:    Assessment and Plan: 39 y.o. male with  Right hip pain/groin pain.  Patient has physical exam significant with pain with hip flexion and internal rotation.  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  However hip flexor  tendinopathy or weakness could also be  a factor here as well.  His pain is mild and not consistent.  Reasonable to try home exercise program first.  If failing that would proceed with MRI arthrogram to further characterize intra-articular hip changes.  Anterior knee pain bilaterally.  Patient has ultrasound exam findings consistent with prepatellar bursitis.  He already is doing some eccentric exercises which I think is a great idea.  Plan to continue those exercises and add Voltaren gel and compressive knee sleeve.  Recheck back if needed.  Wrist pain bilaterally: De Quervain's tenosynovitis.  Discussed exercise program in clinic today with ATC.  Voltaren gel as well recheck back as needed.  This seems to be a much lesser issue.   Orders Placed This Encounter  Procedures  . [place] LIMITED JOINT SPACE STRUCTURES LOW BILAT(NO LINKED CHARGES)    Order Specific Question:   Reason for Exam (SYMPTOM  OR DIAGNOSIS REQUIRED)    Answer:   eval knee pain BL    Order Specific Question:   Preferred imaging location?    Answer:   [facility]  . DG HIP UNILAT WITH PELVIS 2-3 VIEWS RIGHT    Standing Status:   Future    Number of Occurrences:   1    Standing Expiration Date:   09/13/2020    Order Specific Question:   Reason for Exam (SYMPTOM  OR DIAGNOSIS REQUIRED)    Answer:   eval groin and hip pain    Order Specific Question:   Preferred imaging location?    Answer:   [facility]    Order Specific Question:   Radiology Contrast Protocol - do NOT remove file path    Answer:   [email or web address]   No orders of the defined types were placed in this encounter.   Discussed warning signs or symptoms. Please see discharge instructions. Patient expresses understanding.   The above documentation has been reviewed and is accurate and complete Clementeen Graham

## 2019-07-16 NOTE — Progress Notes (Signed)
X-ray hip was read as normal per radiology.  They did not comment on the possibility of impingement.  If not better with physical therapy/home exercise program will proceed with MRI arthrogram.

## 2019-12-12 ENCOUNTER — Encounter: Payer: Self-pay | Admitting: Physician Assistant

## 2020-01-19 ENCOUNTER — Encounter: Payer: Self-pay | Admitting: Physician Assistant

## 2020-01-19 ENCOUNTER — Other Ambulatory Visit: Payer: Self-pay

## 2020-01-19 ENCOUNTER — Ambulatory Visit (INDEPENDENT_AMBULATORY_CARE_PROVIDER_SITE_OTHER): Payer: Managed Care, Other (non HMO) | Admitting: Physician Assistant

## 2020-01-19 VITALS — BP 118/70 | HR 77 | Temp 98.1°F | Ht 72.0 in | Wt 185.0 lb

## 2020-01-19 DIAGNOSIS — K2100 Gastro-esophageal reflux disease with esophagitis, without bleeding: Secondary | ICD-10-CM | POA: Diagnosis not present

## 2020-01-19 MED ORDER — PANTOPRAZOLE SODIUM 40 MG PO TBEC: 40.0000 mg | DELAYED_RELEASE_TABLET | Freq: Every day | ORAL | 1 refills | Status: DC

## 2020-01-19 NOTE — Progress Notes (Signed)
Douglas Powell is a 39 y.o. male here for a follow up of a pre-existing problem.  I acted as a Neurosurgeon for [employer], PA-C Corky Mull, LPN   History of Present Illness:   Chief Complaint  Patient presents with  . Gastroesophageal Reflux    HPI    GERD Pt c/o increase in reflux the past month and has increased his OTC Prilosec to 20 mg BID, increased to BID about a week ago. Denies nausea or vomiting, no diarrhea. Has changed his diet and added more grains and vegetables, eat smaller meals.  At first he thought that this was associated with protein shakes. But after eliminating this he still had issues.  Takes 325 mg ASA daily. Very rare alcohol use.   Never had COVID.   Denies: unintentional weight loss, rectal bleeding, nighttime awakenings of symptoms, vomiting, changes in stools, family history of colon/esophageal cancer, worsening symptoms with activity     Past Medical History:  Diagnosis Date  . Asthma   . Depression   . GERD (gastroesophageal reflux disease)   . Hemorrhoids   . History of palpitations 2012  . Mitral valve insufficiency 2017     Social History   Tobacco Use  . Smoking status: Never Smoker  . Smokeless tobacco: Never Used  Vaping Use  . Vaping Use: Never used  Substance Use Topics  . Alcohol use: Yes    Comment: rarely  . Drug use: Not Currently    Past Surgical History:  Procedure Laterality Date  . NASAL SINUS SURGERY  2008    Family History  Problem Relation Age of Onset  . Diabetes Father   . High Cholesterol Father   . Stroke Father   . Heart disease Sister        Enlarged aorta  . Mental illness Brother   . Breast cancer Neg Hx   . Prostate cancer Neg Hx   . Colon cancer Neg Hx     Allergies  Allergen Reactions  . Aspirin Other (See Comments)    Pt can not have more than 325 mg a day.    Current Medications:   Current Outpatient Medications:  .  albuterol (PROVENTIL HFA;VENTOLIN HFA) 108 (90 Base)  MCG/ACT inhaler, Inhale 2 puffs into the lungs as needed. , Disp: , Rfl:  .  aspirin 325 MG tablet, Take 325 mg by mouth daily., Disp: , Rfl:  .  [employer] BUDESONIDE 32 MCG/ACT nasal spray, PLACE 2 SPRAYS INTO BOTH NOSTRILS 2 (TWO) TIMES DAILY, Disp: 8.43 mL, Rfl: 1 .  fluticasone-salmeterol (ADVAIR HFA) 115-21 MCG/ACT inhaler, Inhale 2 puffs into the lungs 2 (two) times daily., Disp: , Rfl:  .  omeprazole (PRILOSEC OTC) 20 MG tablet, Take 20 mg by mouth as needed., Disp: , Rfl:  .  Olopatadine HCl 0.6 % SOLN, Place 2 sprays into both nostrils daily as needed. , Disp: , Rfl:  .  pantoprazole (PROTONIX) 40 MG tablet, Take 1 tablet (40 mg total) by mouth daily., Disp: 30 tablet, Rfl: 1   Review of Systems:   ROS  Negative unless otherwise specified per HPI.  Vitals:   Vitals:   01/19/20 0835  BP: 118/70  Pulse: 77  Temp: 98.1 F (36.7 C)  TempSrc: Temporal  SpO2: 96%  Weight: 185 lb (83.9 kg)  Height: 6' (1.829 m)     Body mass index is 25.09 kg/m.  Physical Exam:   Physical Exam Vitals and nursing note reviewed.  Constitutional:  General: He is not in acute distress.    Appearance: He is well-developed. He is not ill-appearing or toxic-appearing.  Cardiovascular:     Rate and Rhythm: Normal rate and regular rhythm.     Pulses: Normal pulses.     Heart sounds: Normal heart sounds, S1 normal and S2 normal.     Comments: No LE edema Pulmonary:     Effort: Pulmonary effort is normal.     Breath sounds: Normal breath sounds.  Abdominal:     General: Abdomen is flat. Bowel sounds are normal.     Palpations: Abdomen is soft.     Tenderness: There is no abdominal tenderness.  Skin:    General: Skin is warm and dry.  Neurological:     Mental Status: He is alert.     GCS: GCS eye subscore is 4. GCS verbal subscore is 5. GCS motor subscore is 6.  Psychiatric:        Speech: Speech normal.        Behavior: Behavior normal. Behavior is cooperative.       Assessment and  Plan:   Domnick was seen today for gastroesophageal reflux.  Diagnoses and all orders for this visit:  Gastroesophageal reflux disease with esophagitis, unspecified whether hemorrhage  Other orders -     pantoprazole (PROTONIX) 40 MG tablet; Take 1 tablet (40 mg total) by mouth daily.   Suspect GERD. No red flags on exam or discussion. Will stop prilosec and trial protonix. If no improvement in the next two weeks, will refer to GI.  CMA or LPN served as scribe during this visit. History, Physical, and Plan performed by medical provider. The above documentation has been reviewed and is accurate and complete.   Jarold Motto, PA-C

## 2020-01-19 NOTE — Patient Instructions (Signed)
It was great to see you!  Start Protonix 40 mg daily. This will replace prilosec.  If no improvement in 2 weeks, let me know and we will refer to GI.  FDGard is the supplement you could consider. I don't feel strongly about this -- peppermint is more for IBS.  Take care,  Jarold Motto PA-C

## 2020-01-20 ENCOUNTER — Encounter: Payer: Self-pay | Admitting: Physician Assistant

## 2020-02-07 ENCOUNTER — Other Ambulatory Visit: Payer: Self-pay | Admitting: Physician Assistant

## 2020-02-07 ENCOUNTER — Encounter: Payer: Self-pay | Admitting: Physician Assistant

## 2020-02-07 DIAGNOSIS — K2100 Gastro-esophageal reflux disease with esophagitis, without bleeding: Secondary | ICD-10-CM

## 2020-02-10 ENCOUNTER — Other Ambulatory Visit: Payer: Self-pay | Admitting: Physician Assistant

## 2020-02-24 DIAGNOSIS — J3089 Other allergic rhinitis: Secondary | ICD-10-CM | POA: Insufficient documentation

## 2020-02-24 DIAGNOSIS — J45909 Unspecified asthma, uncomplicated: Secondary | ICD-10-CM | POA: Insufficient documentation

## 2020-03-09 ENCOUNTER — Encounter: Payer: Self-pay | Admitting: Nurse Practitioner

## 2020-03-09 ENCOUNTER — Encounter: Payer: Self-pay | Admitting: Physician Assistant

## 2020-03-16 ENCOUNTER — Other Ambulatory Visit: Payer: Self-pay | Admitting: Physician Assistant

## 2020-03-21 ENCOUNTER — Ambulatory Visit: Payer: Managed Care, Other (non HMO) | Admitting: Nurse Practitioner

## 2020-03-21 ENCOUNTER — Encounter: Payer: Self-pay | Admitting: Nurse Practitioner

## 2020-03-21 VITALS — BP 118/66 | HR 72 | Ht 71.0 in | Wt 188.2 lb

## 2020-03-21 DIAGNOSIS — K219 Gastro-esophageal reflux disease without esophagitis: Secondary | ICD-10-CM

## 2020-03-21 NOTE — Progress Notes (Signed)
____________________________________________________________  Attending physician addendum:  Thank you for sending this case to me. I have reviewed the entire note, and the outlined plan seems appropriate.  Denis Carreon Danis, MD  ____________________________________________________________  

## 2020-03-21 NOTE — Progress Notes (Signed)
03/21/2020 Janeann Merl [id number] 02-Feb-1981   CHIEF COMPLAINT: Reflux symptoms  HISTORY OF PRESENT ILLNESS:  Javarie Crisp is a 39 year old male with a past medical history of depression, asthma and GERD.  S/P sinus surgery 2008. He presents to our office today as referred by Newman Nip, PA-C for further evaluation regarding GERD.  He reports having an history of intermittent GERD symptoms for which he took omeprazole on and off for a few years then stopped taking it 1 or 2 years ago as his symptoms abated.  However, his reflux symptoms recurred after he started drinking protein shakes August 2021.  He developed heartburn with increased burping and abdominal bloat.  He has stopped drinking the protein shakes but he continues to have reflux symptoms.  He was prescribed Protonix 40 mg once daily and initially his symptoms improved but later the Protonix resulted in causing stomach pain.  He stopped taking the Protonix 1 month ago then started taking Zegerid OTC about 1 week ago.  His heartburn and burping symptoms have reduced a little since starting Zegerid.  He denies having any dysphagia.  No upper or lower abdominal pain.  He is passing normal formed brown bowel movements daily.  No rectal bleeding or black stools.  He underwent an EGD in his 30's due to having stomach pain and he was diagnosed with stomach ulcers secondary to ASA use.  He takes Aspirin 325 mg once daily chronically for an inflammatory process primarily involving his sinuses/prior nasal polyps as recommended by his allergist. He is now followed by an immunologist at [facility] with plans to weaning him off of the aspirin.  No other NSAID use.  He previously drank 2 to 3 cups of coffee which he is reduced to less than one half a cup of coffee daily.  He avoids tomato and citrus products.  He no longer eats later at night.  No weight loss.  Father with history of colon polyps.  No other complaints today.  Past Medical  History:  Diagnosis Date  . Asthma   . Depression   . GERD (gastroesophageal reflux disease)   . Hemorrhoids   . History of palpitations 2012  . Mitral valve insufficiency 2017   Past Surgical History:  Procedure Laterality Date  . NASAL SINUS SURGERY  2008   Social History:  He is single.  He has 3 sons and 1 daughter.  He is a Engineer, structural.  Non-smoker.  No alcohol.  No drug use.    Family History:  Father with history of stroke and colon polyps. Mother age 62 healthy.  Sister with heart disease.   Allergies  Allergen Reactions  . Aspirin Other (See Comments)    Pt can not have more than 325 mg a day.      Outpatient Encounter Medications as of 03/21/2020  Medication Sig  . albuterol (PROVENTIL HFA;VENTOLIN HFA) 108 (90 Base) MCG/ACT inhaler Inhale 2 puffs into the lungs as needed.   Marland Kitchen aspirin 325 MG tablet Take 325 mg by mouth daily.  . [employer] BUDESONIDE 32 MCG/ACT nasal spray PLACE 2 SPRAYS INTO BOTH NOSTRILS 2 (TWO) TIMES DAILY  . fluticasone-salmeterol (ADVAIR HFA) 115-21 MCG/ACT inhaler Inhale 2 puffs into the lungs 2 (two) times daily.  . Olopatadine HCl 0.6 % SOLN Place 2 sprays into both nostrils daily as needed.   Marland Kitchen omeprazole (PRILOSEC OTC) 20 MG tablet Take 20 mg by mouth as needed.  . [DISCONTINUED] pantoprazole (PROTONIX) 40 MG  tablet TAKE 1 TABLET BY MOUTH EVERY DAY   No facility-administered encounter medications on file as of 03/21/2020.     REVIEW OF SYSTEMS: Gen: Denies fever, sweats or chills. No weight loss.  CV: Past palpitations thought to be due to stress and caffeine. Patient reported negative cardiac evaluation several years ago. No chest pain. Resp: Denies cough, shortness of breath of hemoptysis.  GI: See HPI. GU : Denies urinary burning, blood in urine, increased urinary frequency or incontinence. MS: Denies joint pain, muscles aches or weakness. Derm: Denies rash, itchiness, skin lesions or unhealing ulcers. Psych: Denies anxiety or  depression. Heme: Denies bruising, bleeding. Neuro:  Denies headaches, dizziness or paresthesias. Endo:  Denies any problems with DM, thyroid or adrenal function.  PHYSICAL EXAM: BP 118/66   Pulse 72   Ht 5\' 11"  (1.803 m)   Wt 188 lb 3.2 oz (85.4 kg)   BMI 26.25 kg/m  General: Well developed 39 year old male in no acute distress. Head: Normocephalic and atraumatic. Eyes:  Sclerae non-icteric, conjunctive pink. Ears: Normal auditory acuity. Mouth: Dentition intact. No ulcers or lesions.  Neck: Supple, no lymphadenopathy or thyromegaly.  Lungs: Clear bilaterally to auscultation without wheezes, crackles or rhonchi. Heart: Regular rate and rhythm. No murmur, rub or gallop appreciated.  Abdomen: Soft, nontender, non distended. No masses. No hepatosplenomegaly. Normoactive bowel sounds x 4 quadrants.  Rectal: Deferred. Musculoskeletal: Symmetrical with no gross deformities. Skin: Warm and dry. No rash or lesions on visible extremities. Extremities: No edema. Neurological: Alert oriented x 4, no focal deficits.  Psychological:  Alert and cooperative. Normal mood and affect.  ASSESSMENT AND PLAN:  47.  39 year old male with GERD.  Prior history of stomach ulcers in his 30s secondary to ASA use. -Continue Zegerid OTC daily for now -GERD handout -EGD benefits and risks discussed including risk with sedation, risk of bleeding, perforation and infection  -Further follow-up to be determined after EGD completed  2. Asthma with aspirin exacerbated respiratory disease, followed by immunologist with plans to wean off ASA. Asthma is stable at this time.   3. Family history of colon polyps -Colonoscopy at age 54, earlier if symptoms warrant           CC:  54, Jarold Motto

## 2020-03-21 NOTE — Patient Instructions (Signed)
You have been scheduled for an endoscopy. Please follow written instructions given to you at your visit today. If you use inhalers (even only as needed), please bring them with you on the day of your procedure.  Continue your over the counter Zegrid daily.  Call our office if your symptoms worsen.  We are giving you a GERD handout to read.   I appreciate the opportunity to care for you. Alcide Evener, CRNP

## 2020-05-16 ENCOUNTER — Encounter: Payer: Self-pay | Admitting: Gastroenterology

## 2020-05-16 ENCOUNTER — Ambulatory Visit (AMBULATORY_SURGERY_CENTER): Payer: Managed Care, Other (non HMO) | Admitting: Gastroenterology

## 2020-05-16 ENCOUNTER — Encounter: Payer: Managed Care, Other (non HMO) | Admitting: Gastroenterology

## 2020-05-16 ENCOUNTER — Other Ambulatory Visit: Payer: Self-pay

## 2020-05-16 VITALS — BP 114/72 | HR 72 | Temp 97.7°F | Resp 10 | Ht 71.0 in | Wt 188.0 lb

## 2020-05-16 DIAGNOSIS — R142 Eructation: Secondary | ICD-10-CM | POA: Diagnosis not present

## 2020-05-16 DIAGNOSIS — K219 Gastro-esophageal reflux disease without esophagitis: Secondary | ICD-10-CM

## 2020-05-16 MED ORDER — SODIUM CHLORIDE 0.9 % IV SOLN: 500.0000 mL | INTRAVENOUS | Status: DC

## 2020-05-16 NOTE — Progress Notes (Signed)
VS-SH 

## 2020-05-16 NOTE — Progress Notes (Signed)
A and O x3. Report to RN. Tolerated MAC anesthesia well.Teeth unchanged after procedure.

## 2020-05-16 NOTE — Patient Instructions (Signed)
Discharge instructions given. ?Normal exam. ?Resume previous medications. ?YOU HAD AN ENDOSCOPIC PROCEDURE TODAY AT THE [facility]:   Refer to the procedure report that was given to you for any specific questions about what was found during the examination.  If the procedure report does not answer your questions, please call your gastroenterologist to clarify.  If you requested that your care partner not be given the details of your procedure findings, then the procedure report has been included in a sealed envelope for you to review at your convenience later. ? ?YOU SHOULD EXPECT: Some feelings of bloating in the abdomen. Passage of more gas than usual.  Walking can help get rid of the air that was put into your GI tract during the procedure and reduce the bloating. If you had a lower endoscopy (such as a colonoscopy or flexible sigmoidoscopy) you may notice spotting of blood in your stool or on the toilet paper. If you underwent a bowel prep for your procedure, you may not have a normal bowel movement for a few days. ? ?Please Note:  You might notice some irritation and congestion in your nose or some drainage.  This is from the oxygen used during your procedure.  There is no need for concern and it should clear up in a day or so. ? ?SYMPTOMS TO REPORT IMMEDIATELY: ? ? ?Following upper endoscopy (EGD) ? Vomiting of blood or coffee ground material ? New chest pain or pain under the shoulder blades ? Painful or persistently difficult swallowing ? New shortness of breath ? Fever of 100?F or higher ? Black, tarry-looking stools ? ?For urgent or emergent issues, a gastroenterologist can be reached at any hour by calling [phone]. ?Do not use MyChart messaging for urgent concerns.  ? ? ?DIET:  We do recommend a small meal at first, but then you may proceed to your regular diet.  Drink plenty of fluids but you should avoid alcoholic beverages for 24 hours. ? ?ACTIVITY:  You should plan to take it easy  for the rest of today and you should NOT DRIVE or use heavy machinery until tomorrow (because of the sedation medicines used during the test).   ? ?FOLLOW UP: ?Our staff will call the number listed on your records 48-72 hours following your procedure to check on you and address any questions or concerns that you may have regarding the information given to you following your procedure. If we do not reach you, we will leave a message.  We will attempt to reach you two times.  During this call, we will ask if you have developed any symptoms of COVID 19. If you develop any symptoms (ie: fever, flu-like symptoms, shortness of breath, cough etc.) before then, please call [phone].  If you test positive for Covid 19 in the 2 weeks post procedure, please call and report this information to us.   ? ?If any biopsies were taken you will be contacted by phone or by letter within the next 1-3 weeks.  Please call us at [phone] if you have not heard about the biopsies in 3 weeks.  ? ? ?SIGNATURES/CONFIDENTIALITY: ?You and/or your care partner have signed paperwork which will be entered into your electronic medical record.  These signatures attest to the fact that that the information above on your After Visit Summary has been reviewed and is understood.  Full responsibility of the confidentiality of this discharge information lies with you and/or your care-partner.  ?

## 2020-05-16 NOTE — Progress Notes (Signed)
Lidocaine buffer  robinol antisialogogue 

## 2020-05-16 NOTE — Op Note (Signed)
[facility] Patient Name: Bharath Bernstein Procedure Date: 05/16/2020 7:58 AM MRN: [id number] Endoscopist: Sherilyn Cooter L. Myrtie Neither , MD Age: 40 Referring MD:  Date of Birth: 06-10-1980 Gender: Male Account #: [id number] Procedure:                Upper GI endoscopy Indications:              Esophageal reflux symptoms that recur despite                            appropriate therapy, Eructation (more than                            heartburn) - no nocturnal symptoms. modest                            improvement with PPI Medicines:                Monitored Anesthesia Care Procedure:                Pre-Anesthesia Assessment:                           - Prior to the procedure, a History and Physical                            was performed, and patient medications and                            allergies were reviewed. The patient's tolerance of                            previous anesthesia was also reviewed. The risks                            and benefits of the procedure and the sedation                            options and risks were discussed with the patient.                            All questions were answered, and informed consent                            was obtained. Prior Anticoagulants: The patient has                            taken no previous anticoagulant or antiplatelet                            agents except for aspirin. ASA Grade Assessment: II                            - A patient with mild systemic disease. After  reviewing the risks and benefits, the patient was                            deemed in satisfactory condition to undergo the                            procedure.                           After obtaining informed consent, the endoscope was                            passed under direct vision. Throughout the                            procedure, the patient's blood pressure, pulse, and                            oxygen  saturations were monitored continuously. The                            Endoscope was introduced through the mouth, and                            advanced to the second part of duodenum. The upper                            GI endoscopy was accomplished without difficulty.                            The patient tolerated the procedure well. Scope In: Scope Out: Findings:                 The esophagus was normal.                           The stomach was normal.                           The cardia and gastric fundus were normal on                            retroflexion.                           The examined duodenum was normal. Complications:            No immediate complications. Estimated Blood Loss:     Estimated blood loss: none. Impression:               - Normal esophagus.                           - Normal stomach.                           - Normal examined duodenum.                           -  No specimens collected.                           Suspected supragastric belching contributing to                            symptoms. Recommendation:           - Patient has a contact number available for                            emergencies. The signs and symptoms of potential                            delayed complications were discussed with the                            patient. Return to normal activities tomorrow.                            Written discharge instructions were provided to the                            patient.                           - Resume previous diet.                           - Continue present medications.                           - Follow an antireflux regimen indefinitely.                            Smaller portions, remain upright for at least an                            hour after meals.                           - Return to GI office PRN. Emmarae Cowdery L. Myrtie Neither, MD 05/16/2020 8:25:47 AM This report has been signed electronically.

## 2020-05-18 ENCOUNTER — Telehealth: Payer: Self-pay

## 2020-05-18 NOTE — Telephone Encounter (Signed)
  Follow up Call-  Call back number 05/16/2020  Post procedure Call Back phone  # [phone]  Permission to leave phone message Yes     Left message

## 2020-05-18 NOTE — Telephone Encounter (Signed)
  Follow up Call-  Call back number 05/16/2020  Post procedure Call Back phone  # [phone]  Permission to leave phone message Yes     Patient questions:  Do you have a fever, pain , or abdominal swelling? No. Pain Score  0 *  Have you tolerated food without any problems? Yes.    Have you been able to return to your normal activities? Yes.    Do you have any questions about your discharge instructions: Diet   No. Medications  No. Follow up visit  No.  Do you have questions or concerns about your Care? No.  Actions: * If pain score is 4 or above: No action needed, pain <4.  1. Have you developed a fever since your procedure? no  2.   Have you had an respiratory symptoms (SOB or cough) since your procedure? no  3.   Have you tested positive for COVID 19 since your procedure no  4.   Have you had any family members/close contacts diagnosed with the COVID 19 since your procedure?  no   If yes to any of these questions please route to Laverna Peace, RN and Karlton Lemon, RN

## 2021-06-27 ENCOUNTER — Other Ambulatory Visit: Payer: Self-pay

## 2021-06-27 ENCOUNTER — Ambulatory Visit: Payer: Managed Care, Other (non HMO) | Admitting: Physician Assistant

## 2021-06-27 ENCOUNTER — Encounter: Payer: Self-pay | Admitting: Physician Assistant

## 2021-06-27 VITALS — BP 110/80 | HR 80 | Temp 98.0°F | Ht 71.0 in | Wt 183.2 lb

## 2021-06-27 DIAGNOSIS — L639 Alopecia areata, unspecified: Secondary | ICD-10-CM

## 2021-06-27 MED ORDER — TRIAMCINOLONE ACETONIDE 0.1 % EX CREA: TOPICAL_CREAM | CUTANEOUS | 0 refills | Status: AC

## 2021-06-27 NOTE — Progress Notes (Signed)
Douglas Powell is a 41 y.o. male here for alopecia.  History of Present Illness:   Chief Complaint  Patient presents with   Alopecia    Pt c/o hair loss on his mustache in the middle x  1 month.    HPI  Alopecia Douglas Powell presents with c/o hair loss in the middle of his mustache which has been onset for several months, becoming more noticeable within the past month. Upon researching his sx, he was informed that he could be experiencing alopecia. As a result, he decided to have this evaluated to make sure nothing more significant is occurring and to see what could be done. Pt has not used any medication for this issue, mainly just shaving down his beard/mustache.   Denies itchiness, flakiness, redness, or recent trauma.     Past Medical History:  Diagnosis Date   Allergy    Anxiety    Arrhythmia    Asthma    Depression    GERD (gastroesophageal reflux disease)    Hemorrhoids    History of palpitations 2012   Mitral valve insufficiency 2017     Social History   Tobacco Use   Smoking status: Never   Smokeless tobacco: Never  Vaping Use   Vaping Use: Never used  Substance Use Topics   Alcohol use: Yes    Comment: rarely   Drug use: Not Currently    Past Surgical History:  Procedure Laterality Date   NASAL SINUS SURGERY  2008   UPPER GASTROINTESTINAL ENDOSCOPY      Family History  Problem Relation Age of Onset   Diabetes Father    High Cholesterol Father    Stroke Father    Colon polyps Father    Heart disease Sister        Enlarged aorta   Mental illness Brother    Breast cancer Neg Hx    Prostate cancer Neg Hx    Colon cancer Neg Hx    Esophageal cancer Neg Hx    Rectal cancer Neg Hx    Stomach cancer Neg Hx     Allergies  Allergen Reactions   Aspirin Other (See Comments)    Pt can not have more than 325 mg a day.    Current Medications:   Current Outpatient Medications:    albuterol (PROVENTIL HFA;VENTOLIN HFA) 108 (90 Base) MCG/ACT inhaler,  Inhale 2 puffs into the lungs as needed. , Disp: , Rfl:    aspirin 325 MG tablet, Take 325 mg by mouth daily., Disp: , Rfl:    Budesonide ER 6 MG CP24, ADD 10ML (ONE DOSE) OF MEDICATION TO 240ML OF SALINE IN SINUS RINSE BOTTLE. IRRIGATE SINUSES WITH 120ML THROUGH EACH NOSTRIL TWICE DAILY, Disp: , Rfl:    fluticasone-salmeterol (ADVAIR HFA) 115-21 MCG/ACT inhaler, Inhale 2 puffs into the lungs 2 (two) times daily., Disp: , Rfl:    triamcinolone cream (KENALOG) 0.1 %, Apply to affected area 1-2 times daily, Disp: 80 g, Rfl: 0   Review of Systems:   ROS Negative unless otherwise specified per HPI. Vitals:   Vitals:   06/27/21 0841  BP: 110/80  Pulse: 80  Temp: 98 F (36.7 C)  TempSrc: Temporal  SpO2: 96%  Weight: 183 lb 4 oz (83.1 kg)  Height: 5\' 11"  (1.803 m)     Body mass index is 25.56 kg/m.  Physical Exam:   Physical Exam Vitals and nursing note reviewed.  Constitutional:      General: He is not in acute distress.  Appearance: He is well-developed. He is not ill-appearing or toxic-appearing.  HENT:     Head:     Comments: Approximately 0.5 cm area of hair loss midline of mustache  No erythema, lesion, or swelling present  Cardiovascular:     Rate and Rhythm: Normal rate and regular rhythm.     Pulses: Normal pulses.     Heart sounds: Normal heart sounds, S1 normal and S2 normal.  Pulmonary:     Effort: Pulmonary effort is normal.     Breath sounds: Normal breath sounds.  Skin:    General: Skin is warm and dry.  Neurological:     Mental Status: He is alert.     GCS: GCS eye subscore is 4. GCS verbal subscore is 5. GCS motor subscore is 6.  Psychiatric:        Speech: Speech normal.        Behavior: Behavior normal. Behavior is cooperative.    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  I, Inda Coke, [place], have reviewed all documentation for this visit. The documentation on 06/27/21 for the exam, diagnosis, procedures, and orders are all accurate and complete.   Inda Coke, PA-C

## 2021-06-27 NOTE — Patient Instructions (Signed)
It was great to see you!  Referral to dermatology placed today  Start topical kenalog ointment 1-2 times daily for 6 weeks  Take care,  Jarold Motto PA-C

## 2021-07-11 ENCOUNTER — Telehealth: Payer: Self-pay | Admitting: Physician Assistant

## 2021-07-11 NOTE — Telephone Encounter (Signed)
Referral attached to appointment

## 2021-07-11 NOTE — Telephone Encounter (Signed)
Patient is calling for a referral appointment from [place], [place].  Patient is scheduled for 02/19/2022 at 9:00 with [facility], PA-C. ?

## 2021-07-31 NOTE — Progress Notes (Signed)
? ?  I, Christoper Fabian, LAT, ATC, am serving as scribe for Dr. Clementeen Graham. ? ?Douglas Powell is a 41 y.o. male who presents to [facility] today for f/u of L knee pain.  He was last seen by Dr. Denyse Amass on 07/15/19 for a multitude of c/o including B knee pain, R groin and B thumb pain.  He was advised to con't eccentric quad strengthening and to use Voltaren gel and a knee sleeve.  Today, pt reports that his L knee pain is intermittent in nature.  He states that he feels like his knee pain is "on the edge."  He notes his knee will feel like it gives way on him at times randomly.  He denies any L knee swelling or mechanical symptoms.  He notes a specific knee ROM that seems to bother him.  His symptoms are very random. ? ? ?Pertinent review of systems: No fevers or chills ? ?Relevant historical information: Asthma ? ? ?Exam:  ?BP 104/70 (BP Location: Right Arm, Patient Position: Sitting, Cuff Size: Normal)   Pulse 80   Ht 5\' 11"  (1.803 m)   Wt 185 lb 3.2 oz (84 kg)   SpO2 95%   BMI 25.83 kg/m?  ?General: Well Developed, well nourished, and in no acute distress.  ? ?MSK: Left knee normal-appearing ?Normal motion. ?Nontender. ?Stable ligamentous exam. ?Negative McMurray's test. ?Intact strength ? ? ? ?Lab and Radiology Results ? ?X-ray images left knee obtained today personally and independently interpreted ?No significant degenerative changes.  No acute fractures.  No loose bodies in the superior patellar space ?Await formal radiology review ? ? ? ? ?Assessment and Plan: ?41 y.o. male with left knee pain and a feeling that his knee will give way at times.  Etiology is unclear.  He could have loose bodies that interfere with quad tendon or the patellofemoral joint.  He could have occasional quad tendinitis that bothers him or he could have patellofemoral chondromalacia that hurts him at times.  We discussed options.  Plan for trial of physical therapy.  If this does not help next step  should probably be MRI. ?Recheck in 8 weeks ? ? ?PDMP not reviewed this encounter. ?Orders Placed This Encounter  ?Procedures  ? DG Knee AP/LAT W/Sunrise Left  ?  Standing Status:   Future  ?  Number of Occurrences:   1  ?  Standing Expiration Date:   09/01/2021  ?  Order Specific Question:   Reason for Exam (SYMPTOM  OR DIAGNOSIS REQUIRED)  ?  Answer:   L knee pain  ?  Order Specific Question:   Preferred imaging location?  ?  Answer:   09/03/2021  ? Ambulatory referral to Physical Therapy  ?  Referral Priority:   Routine  ?  Referral Type:   Physical Medicine  ?  Referral Reason:   Specialty Services Required  ?  Requested Specialty:   Physical Therapy  ?  Number of Visits Requested:   1  ? ?No orders of the defined types were placed in this encounter. ? ? ? ?Discussed warning signs or symptoms. Please see discharge instructions. Patient expresses understanding. ? ? ?The above documentation has been reviewed and is accurate and complete Kyra Searles, M.D. ? ? ?

## 2021-08-01 ENCOUNTER — Other Ambulatory Visit: Payer: Self-pay

## 2021-08-01 ENCOUNTER — Ambulatory Visit (INDEPENDENT_AMBULATORY_CARE_PROVIDER_SITE_OTHER): Payer: Managed Care, Other (non HMO)

## 2021-08-01 ENCOUNTER — Encounter: Payer: Self-pay | Admitting: Family Medicine

## 2021-08-01 ENCOUNTER — Ambulatory Visit (INDEPENDENT_AMBULATORY_CARE_PROVIDER_SITE_OTHER): Payer: Managed Care, Other (non HMO) | Admitting: Family Medicine

## 2021-08-01 VITALS — BP 104/70 | HR 80 | Ht 71.0 in | Wt 185.2 lb

## 2021-08-01 DIAGNOSIS — M25562 Pain in left knee: Secondary | ICD-10-CM | POA: Diagnosis not present

## 2021-08-01 DIAGNOSIS — G8929 Other chronic pain: Secondary | ICD-10-CM

## 2021-08-01 NOTE — Patient Instructions (Addendum)
Good to see you today. ? ?I've referred you to Physical Therapy.  Their office will call you to schedule but please let us know if you haven't heard from them in one week regarding scheduling. ? ?Follow-up: 8 weeks ?

## 2021-08-06 NOTE — Progress Notes (Signed)
Left knee x-ray looks normal to radiology

## 2021-08-29 ENCOUNTER — Ambulatory Visit (HOSPITAL_BASED_OUTPATIENT_CLINIC_OR_DEPARTMENT_OTHER): Payer: Managed Care, Other (non HMO) | Attending: Family Medicine | Admitting: Physical Therapy

## 2021-08-29 ENCOUNTER — Encounter (HOSPITAL_BASED_OUTPATIENT_CLINIC_OR_DEPARTMENT_OTHER): Payer: Self-pay | Admitting: Physical Therapy

## 2021-08-29 DIAGNOSIS — G8929 Other chronic pain: Secondary | ICD-10-CM | POA: Diagnosis not present

## 2021-08-29 DIAGNOSIS — M25562 Pain in left knee: Secondary | ICD-10-CM | POA: Diagnosis present

## 2021-08-29 DIAGNOSIS — R2689 Other abnormalities of gait and mobility: Secondary | ICD-10-CM | POA: Insufficient documentation

## 2021-08-29 NOTE — Therapy (Signed)
?OUTPATIENT PHYSICAL THERAPY LOWER EXTREMITY EVALUATION ? ? ?Patient Name: Douglas Powell ?MRN: [id number] ?DOB:01-29-1981, 41 y.o., male ?Today's Date: 08/30/2021 ? ? PT End of Session - 08/29/21 0858   ? ? Visit Number 1   ? Number of Visits 6   ? Date for PT Re-Evaluation 10/10/21   ? PT Start Time 0845   ? PT Stop Time 0927   ? PT Time Calculation (min) 42 min   ? Activity Tolerance Patient tolerated treatment well   ? Behavior During Therapy [facility] for tasks assessed/performed   ? ?  ?  ? ?  ? ? ?Past Medical History:  ?Diagnosis Date  ? Allergy   ? Anxiety   ? Arrhythmia   ? Asthma   ? Depression   ? GERD (gastroesophageal reflux disease)   ? Hemorrhoids   ? History of palpitations 2012  ? Mitral valve insufficiency 2017  ? ?Past Surgical History:  ?Procedure Laterality Date  ? NASAL SINUS SURGERY  2008  ? UPPER GASTROINTESTINAL ENDOSCOPY    ? ?Patient Active Problem List  ? Diagnosis Date Noted  ? Non-seasonal allergic rhinitis 02/24/2020  ? Aspirin-exacerbated respiratory disease (AERD) 02/24/2020  ? Prepatellar bursitis of both knees 07/15/2019  ? Right hip pain 07/15/2019  ? Palpitations 09/04/2018  ? SOB (shortness of breath) 09/04/2018  ? GERD (gastroesophageal reflux disease)   ? Asthma   ? Mitral valve insufficiency 05/14/2015  ? History of palpitations 05/13/2010  ? ? ?PCP: Jarold Motto, PA ? ?REFERRING PROVIDER: Rodolph Bong, MD ? ?REFERRING DIAG: M25.562,G89.29 (ICD-10-CM) - Chronic pain of left knee ? ?THERAPY DIAG:  ?Chronic pain of left knee ? ?Other abnormalities of gait and mobility ? ?ONSET DATE: On and off for a few years.  ? ?SUBJECTIVE:  ? ?SUBJECTIVE STATEMENT: ?Patient has a history of left knee pain. The pain will come on and last about 3 days and then the pain goes away. When the pain comes it can be intense.  ? ?PERTINENT HISTORY: ?Anxiety, Mitral valve insufficiency,  ? ?PAIN:  ?Are you having pain? Yes: NPRS scale: 0/10 has not had pain for a month or so now  ?Pain location: Left  superior knee ?Pain description: sharp   ?Aggravating factors: could be anything; when it hurts loading it  ?Relieving factors: rest and voltarin  ? ?PRECAUTIONS: None ? ?WEIGHT BEARING RESTRICTIONS No ? ?FALLS:  ?Has patient fallen in last 6 months? No ? ?LIVING ENVIRONMENT: ?Has  half a flight of stairs at his house  ?OCCUPATION: Engineer, structural ? ?PLOF: Independent ? ?PATIENT GOALS to not have these times of increased pain  ? ? ?OBJECTIVE:  ? ?DIAGNOSTIC FINDINGS: X-ray: right negative  ? ?PATIENT SURVEYS:  ?FOTO 30% on intake 60% expected  ? ?COGNITION: ? Overall cognitive status: Within functional limits for tasks assessed   ?  ?SENSATION: ?WFL ? ?MUSCLE LENGTH: ?Mild quad and hamstring tightness  ? ?POSTURE:  ?Bilateral flat foot. Patient is aware  ? ?PALPATION: ?Did not identify any tenderness to palpation  ? ? ?LE ROM: ? ?Full active and passive flexion  ? ?LE MMT: ? ?MMT Right ?08/30/2021 Left ?08/30/2021  ?Hip flexion 53.1 62.0  ?Hip extension    ?Hip abduction 80.7 77  ?Hip adduction    ?Hip internal rotation    ?Hip external rotation    ?Knee flexion    ?Knee extension 81.1 80  ?Ankle dorsiflexion    ?Ankle plantarflexion    ?Ankle inversion    ?Ankle eversion    ? (  Blank rows = not tested) ? ?LOWER EXTREMITY SPECIAL TESTS:  ?Knee special tests: McMurray's test: negative ?Verus/ valgus test negative  ? ? ?FUNCTIONAL TESTS:  ?Single leg stance limited bilateral foot supination with single leg stance. ?Squat: good squat. Keeps good form. No pain  ? ?GAIT: ?Bilateral foot supination to pronation.  ? ? ?TODAY'S TREATMENT: ? ?Exercises ?- Modified Thomas Stretch  - 1 x daily - 7 x weekly - 3 sets - 10 reps ?- The Diver  - 1 x daily - 7 x weekly - 3 sets - 10 reps ?- Side Stepping with Resistance at Thighs  - 1 x daily - 7 x weekly - 3 sets - 10 reps ?- 3-Way Lunge  - 1 x daily - 7 x weekly - 3 sets - 10 reps ? ?Patient also asked about wall sits and hamstring stretches. He was advised both of thos exercises  would be great for him  ? ?PATIENT EDUCATION:  ?Education details: reviewed HEp and symptom management  ?Person educated: Patient ?Education method: Explanation, Demonstration, Tactile cues, Verbal cues, and Handouts ?Education comprehension: verbalized understanding and returned demonstration ? ? ?HOME EXERCISE PROGRAM: ?Access Code: [email or web address] ?URL: [email or web address] ?Date: 08/29/2021 ?Prepared by: Lorayne Bender ? ?Exercises ?- Modified Thomas Stretch  - 1 x daily - 7 x weekly - 3 sets - 10 reps ?- The Diver  - 1 x daily - 7 x weekly - 3 sets - 10 reps ?- Side Stepping with Resistance at Thighs  - 1 x daily - 7 x weekly - 3 sets - 10 reps ?- 3-Way Lunge  - 1 x daily - 7 x weekly - 3 sets - 10 reps ? ?ASSESSMENT: ? ?CLINICAL IMPRESSION: ?Patient is a 41 y.o. male who was seen today for physical therapy evaluation and treatment for left knee pain.  ?His knee goes through episodes of pain. Currently he is not having pain. Therapy was unable to reproduce his pain at this time. An ultrasound showed patellar bursitis at one point. This is likely. His strength was equal L V R. He does have flat foot and supination in single leg stance. He was advised he may want to get an orthotic and a supportive shoe. He was given a thomas stretch for his quad. He was also given low range stability exercises to work on. He will work on his exercises for 2 weeks,then we will assess how his knee is doing and advance his exercise.  ? ?OBJECTIVE IMPAIRMENTS Abnormal gait, decreased activity tolerance, and pain.  ? ?ACTIVITY LIMITATIONS  when he is inflamed stairs and daily activity is limited  .  ? ?PERSONAL FACTORS None ? ? ?REHAB POTENTIAL: Excellent ? ?CLINICAL DECISION MAKING: Stable/uncomplicated ? ?EVALUATION COMPLEXITY: Low ? ? ?GOALS: ?Goals reviewed with patient? Yes ? ?SHORT TERM GOALS: Target date: 09/20/2021 ? ?Patient will obtain shoes that have good support  ?Baseline: ?Goal status: INITIAL ? ?2.  Patient will  improve single leg stance time to 30 seconds without excessive supination of the foot  ?Baseline:  ?Goal status: INITIAL ? ?3.  Patient will go 3 weeks without flair-up  ?Baseline:  ?Goal status: INITIAL ? ? ?LONG TERM GOALS: Target date: 10/11/2021 ? ?Patient will have a full gym program that does not hurt his knee or cause an exacerbation  ?Baseline:  ?Goal status: INITIAL ? ?2.  Patient will go up/down the steps without  ?Baseline:  ?Goal status: INITIAL ? ? ?PLAN: ?PT FREQUENCY: 1x/week ? ?PT DURATION: 6 weeks ? ?  PLANNED INTERVENTIONS: Therapeutic exercises, Therapeutic activity, Neuromuscular re-education, Balance training, Gait training, Patient/Family education, Joint mobilization, Stair training, Dry Needling, Ultrasound, and Manual therapy ? ?PLAN FOR NEXT SESSION: review how the patient did with his exercises. Progress single leg stance activity. Progress banded short movement activity as tolerated.  ? ? ?Dessie Comaavid J Danaysha Kirn, PT ?08/30/2021, 8:19 AM  ?

## 2021-08-30 ENCOUNTER — Encounter (HOSPITAL_BASED_OUTPATIENT_CLINIC_OR_DEPARTMENT_OTHER): Payer: Self-pay | Admitting: Physical Therapy

## 2021-09-13 ENCOUNTER — Ambulatory Visit (HOSPITAL_BASED_OUTPATIENT_CLINIC_OR_DEPARTMENT_OTHER): Payer: Managed Care, Other (non HMO) | Attending: Family Medicine | Admitting: Physical Therapy

## 2021-09-13 ENCOUNTER — Encounter (HOSPITAL_BASED_OUTPATIENT_CLINIC_OR_DEPARTMENT_OTHER): Payer: Self-pay | Admitting: Physical Therapy

## 2021-09-13 DIAGNOSIS — M25562 Pain in left knee: Secondary | ICD-10-CM | POA: Insufficient documentation

## 2021-09-13 DIAGNOSIS — G8929 Other chronic pain: Secondary | ICD-10-CM

## 2021-09-13 DIAGNOSIS — R2689 Other abnormalities of gait and mobility: Secondary | ICD-10-CM | POA: Diagnosis present

## 2021-09-13 NOTE — Therapy (Signed)
?OUTPATIENT PHYSICAL THERAPY LOWER EXTREMITY EVALUATION ? ? ?Patient Name: Douglas Powell ?MRN: [id number] ?DOB:05-25-1980, 41 y.o., male ?Today's Date: 09/13/2021 ? ? PT End of Session - 09/13/21 2039   ? ? Visit Number 2   ? Number of Visits 6   ? Date for PT Re-Evaluation 10/10/21   ? PT Start Time 1230   ? PT Stop Time 1313   ? PT Time Calculation (min) 43 min   ? Activity Tolerance Patient tolerated treatment well   ? Behavior During Therapy [facility] for tasks assessed/performed   ? ?  ?  ? ?  ? ? ? ?Past Medical History:  ?Diagnosis Date  ? Allergy   ? Anxiety   ? Arrhythmia   ? Asthma   ? Depression   ? GERD (gastroesophageal reflux disease)   ? Hemorrhoids   ? History of palpitations 2012  ? Mitral valve insufficiency 2017  ? ?Past Surgical History:  ?Procedure Laterality Date  ? NASAL SINUS SURGERY  2008  ? UPPER GASTROINTESTINAL ENDOSCOPY    ? ?Patient Active Problem List  ? Diagnosis Date Noted  ? Non-seasonal allergic rhinitis 02/24/2020  ? Aspirin-exacerbated respiratory disease (AERD) 02/24/2020  ? Prepatellar bursitis of both knees 07/15/2019  ? Right hip pain 07/15/2019  ? Palpitations 09/04/2018  ? SOB (shortness of breath) 09/04/2018  ? GERD (gastroesophageal reflux disease)   ? Asthma   ? Mitral valve insufficiency 05/14/2015  ? History of palpitations 05/13/2010  ? ? ?PCP: Jarold Motto, PA ? ?REFERRING PROVIDER: Jarold Motto, PA ? ?REFERRING DIAG: M25.562,G89.29 (ICD-10-CM) - Chronic pain of left knee ? ?THERAPY DIAG:  ?Chronic pain of left knee ? ?Other abnormalities of gait and mobility ? ?ONSET DATE: On and off for a few years.  ? ?SUBJECTIVE:  ? ?SUBJECTIVE STATEMENT: ?The patient reports just minor soreness at times. Overall he has been doing well. He is doing well with the exercises.  ?PERTINENT HISTORY: ?Anxiety, Mitral valve insufficiency,  ? ?PAIN:  ?Are you having pain? Yes: NPRS scale: 0/10 has not had pain for a month or so now  ?Pain location: Left superior knee ?Pain description:  sharp   ?Aggravating factors: could be anything; when it hurts loading it  ?Relieving factors: rest and voltarin  ? ?PRECAUTIONS: None ? ?WEIGHT BEARING RESTRICTIONS No ? ?FALLS:  ?Has patient fallen in last 6 months? No ? ?LIVING ENVIRONMENT: ?Has  half a flight of stairs at his house  ?OCCUPATION: Engineer, structural ? ?PLOF: Independent ? ?PATIENT GOALS to not have these times of increased pain  ? ? ?OBJECTIVE:  ? ?DIAGNOSTIC FINDINGS: X-ray: right negative  ? ?PATIENT SURVEYS:  ?FOTO 30% on intake 60% expected  ? ?COGNITION: ? Overall cognitive status: Within functional limits for tasks assessed   ?  ?SENSATION: ?WFL ? ?MUSCLE LENGTH: ?Mild quad and hamstring tightness  ? ?POSTURE:  ?Bilateral flat foot. Patient is aware  ? ?PALPATION: ?Did not identify any tenderness to palpation  ? ? ?LE ROM: ? ?Full active and passive flexion  ? ?LE MMT: ? ?MMT Right ?09/13/2021 Left ?09/13/2021  ?Hip flexion 53.1 62.0  ?Hip extension    ?Hip abduction 80.7 77  ?Hip adduction    ?Hip internal rotation    ?Hip external rotation    ?Knee flexion    ?Knee extension 81.1 80  ?Ankle dorsiflexion    ?Ankle plantarflexion    ?Ankle inversion    ?Ankle eversion    ? (Blank rows = not tested) ? ?LOWER EXTREMITY SPECIAL  TESTS:  ?Knee special tests: McMurray's test: negative ?Verus/ valgus test negative  ? ? ?FUNCTIONAL TESTS:  ?Single leg stance limited bilateral foot supination with single leg stance. ?Squat: good squat. Keeps good form. No pain  ? ?GAIT: ?Bilateral foot supination to pronation.  ? ? ?TODAY'S TREATMENT: ?5/4 ?Star excursions x3 4 way each way ?Single leg diver 2x10  ? ?Banded squat 2x10 blue  ?Monster walk 2x10 blue  ? ?Bridge with band x20  ?Single leg bridge x20  ?Low range SL flutter x20  ? ?Thomas stretch 3x20 sec hold  ? ?LAQ with band 2x10 blue  ? ? ? ? ? ? ? ?Exercises ?- Modified Thomas Stretch  - 1 x daily - 7 x weekly - 3 sets - 10 reps ?- The Diver  - 1 x daily - 7 x weekly - 3 sets - 10 reps ?- Side Stepping with  Resistance at Thighs  - 1 x daily - 7 x weekly - 3 sets - 10 reps ?- 3-Way Lunge  - 1 x daily - 7 x weekly - 3 sets - 10 reps ? ?Patient also asked about wall sits and hamstring stretches. He was advised both of thos exercises would be great for him  ? ?PATIENT EDUCATION:  ?Education details: reviewed HEp and symptom management  ?Person educated: Patient ?Education method: Explanation, Demonstration, Tactile cues, Verbal cues, and Handouts ?Education comprehension: verbalized understanding and returned demonstration ? ? ?HOME EXERCISE PROGRAM: ?Access Code: [email or web address] ?URL: [email or web address] ?Date: 08/29/2021 ?Prepared by: Lorayne Benderavid Rayshaun Needle ? ?Exercises ?- Modified Thomas Stretch  - 1 x daily - 7 x weekly - 3 sets - 10 reps ?- The Diver  - 1 x daily - 7 x weekly - 3 sets - 10 reps ?- Side Stepping with Resistance at Thighs  - 1 x daily - 7 x weekly - 3 sets - 10 reps ?- 3-Way Lunge  - 1 x daily - 7 x weekly - 3 sets - 10 reps ? ?ASSESSMENT: ? ?CLINICAL IMPRESSION: ?The patient tolerated exercises well. We expaned a a few different groups of exercises. He had no significant pain.  We will assess pain next visit. If he is doing well we will leave his case open and have him work on his exercises.  ?OBJECTIVE IMPAIRMENTS Abnormal gait, decreased activity tolerance, and pain.  ? ?ACTIVITY LIMITATIONS  when he is inflamed stairs and daily activity is limited  .  ? ?PERSONAL FACTORS None ? ? ?REHAB POTENTIAL: Excellent ? ?CLINICAL DECISION MAKING: Stable/uncomplicated ? ?EVALUATION COMPLEXITY: Low ? ? ?GOALS: ?Goals reviewed with patient? Yes ? ?SHORT TERM GOALS: Target date: 10/04/2021 ? ?Patient will obtain shoes that have good support  ?Baseline: ?Goal status: INITIAL ? ?2.  Patient will improve single leg stance time to 30 seconds without excessive supination of the foot  ?Baseline:  ?Goal status: INITIAL ? ?3.  Patient will go 3 weeks without flair-up  ?Baseline:  ?Goal status: INITIAL ? ? ?LONG TERM GOALS:  Target date: 10/25/2021 ? ?Patient will have a full gym program that does not hurt his knee or cause an exacerbation  ?Baseline:  ?Goal status: INITIAL ? ?2.  Patient will go up/down the steps without  ?Baseline:  ?Goal status: INITIAL ? ? ?PLAN: ?PT FREQUENCY: 1x/week ? ?PT DURATION: 6 weeks ? ?PLANNED INTERVENTIONS: Therapeutic exercises, Therapeutic activity, Neuromuscular re-education, Balance training, Gait training, Patient/Family education, Joint mobilization, Stair training, Dry Needling, Ultrasound, and Manual therapy ? ?PLAN FOR NEXT SESSION: review how the patient  did with his exercises. Progress single leg stance activity. Progress banded short movement activity as tolerated.  ? ? ?Dessie Coma, PT ?09/13/2021, 8:46 PM  ?

## 2021-09-20 ENCOUNTER — Ambulatory Visit (HOSPITAL_BASED_OUTPATIENT_CLINIC_OR_DEPARTMENT_OTHER): Payer: Managed Care, Other (non HMO) | Admitting: Physical Therapy

## 2021-09-20 ENCOUNTER — Encounter (HOSPITAL_BASED_OUTPATIENT_CLINIC_OR_DEPARTMENT_OTHER): Payer: Self-pay | Admitting: Physical Therapy

## 2021-09-20 DIAGNOSIS — G8929 Other chronic pain: Secondary | ICD-10-CM

## 2021-09-20 DIAGNOSIS — M25562 Pain in left knee: Secondary | ICD-10-CM | POA: Diagnosis not present

## 2021-09-20 DIAGNOSIS — R2689 Other abnormalities of gait and mobility: Secondary | ICD-10-CM

## 2021-09-20 NOTE — Therapy (Addendum)
OUTPATIENT PHYSICAL THERAPY LOWER EXTREMITY EVALUATION/Discharge    Patient Name: Douglas Powell MRN: [id number] DOB:02/03/1981, 41 y.o., male Today's Date: 09/21/2021   PT End of Session - 09/20/21 1152     Visit Number 3    Number of Visits 6    Date for PT Re-Evaluation 10/10/21    PT Start Time 8416    PT Stop Time 1227    PT Time Calculation (min) 42 min    Activity Tolerance Patient tolerated treatment well    Behavior During Therapy [facility] for tasks assessed/performed              Past Medical History:  Diagnosis Date   Allergy    Anxiety    Arrhythmia    Asthma    Depression    GERD (gastroesophageal reflux disease)    Hemorrhoids    History of palpitations 2012   Mitral valve insufficiency 2017   Past Surgical History:  Procedure Laterality Date   NASAL SINUS SURGERY  2008   UPPER GASTROINTESTINAL ENDOSCOPY     Patient Active Problem List   Diagnosis Date Noted   Non-seasonal allergic rhinitis 02/24/2020   Aspirin-exacerbated respiratory disease (AERD) 02/24/2020   Prepatellar bursitis of both knees 07/15/2019   Right hip pain 07/15/2019   Palpitations 09/04/2018   SOB (shortness of breath) 09/04/2018   GERD (gastroesophageal reflux disease)    Asthma    Mitral valve insufficiency 05/14/2015   History of palpitations 05/13/2010    PCP: Inda Coke, PA  REFERRING PROVIDER: Inda Coke, PA  REFERRING DIAG: [phone] (ICD-10-CM) - Chronic pain of left knee  THERAPY DIAG:  Chronic pain of left knee  Other abnormalities of gait and mobility  ONSET DATE: On and off for a few years.   SUBJECTIVE:   SUBJECTIVE STATEMENT: The patient had done very well until 2 days ago. He was doing his band walk when he felt the pull again. The pain lasted for about 2 days. It is better today  PERTINENT HISTORY: Anxiety, Mitral valve insufficiency,   PAIN:  Are you having pain? Yes: NPRS scale: 0/10 has not had pain for a month or so now   Pain location: Left superior knee Pain description: sharp   Aggravating factors: could be anything; when it hurts loading it  Relieving factors: rest and voltarin   PRECAUTIONS: None  WEIGHT BEARING RESTRICTIONS No  FALLS:  Has patient fallen in last 6 months? No  LIVING ENVIRONMENT: Has  half a flight of stairs at his house  OCCUPATION: Building services engineer  PLOF: [place] to not have these times of increased pain    OBJECTIVE:   DIAGNOSTIC FINDINGS: X-ray: right negative   PATIENT SURVEYS:  FOTO 30% on intake 60% expected   COGNITION:  Overall cognitive status: Within functional limits for tasks assessed     SENSATION: WFL  MUSCLE LENGTH: Mild quad and hamstring tightness   POSTURE:  Bilateral flat foot. Patient is aware   PALPATION: Did not identify any tenderness to palpation    LE ROM:  Full active and passive flexion   LE MMT:  MMT Right 09/21/2021 Left 09/21/2021  Hip flexion 53.1 62.0  Hip extension    Hip abduction 80.7 77  Hip adduction    Hip internal rotation    Hip external rotation    Knee flexion    Knee extension 81.1 80  Ankle dorsiflexion    Ankle plantarflexion    Ankle inversion    Ankle eversion     (  Blank rows = not tested)  LOWER EXTREMITY SPECIAL TESTS:  Knee special tests: McMurray's test: negative Verus/ valgus test negative    FUNCTIONAL TESTS:  Single leg stance limited bilateral foot supination with single leg stance. Squat: good squat. Keeps good form. No pain   GAIT: Bilateral foot supination to pronation.    TODAY'S TREATMENT: 5/11 Reviewed HEP; reviewed progression of activity going forward.  Single leg bridge SLR x20 each leg   Manual: looked for trigger points in the lower quad area.   Air-ex: step on and off x20 each leg  Lateral step on and off x20   Low amplitude jump with cuing for sound x30 sec     5/4 Star excursions x3 4 way each way Single leg diver 2x10   Banded  squat 2x10 blue  Monster walk 2x10 blue   Bridge with band x20  Single leg bridge x20  Low range SL flutter x20   Thomas stretch 3x20 sec hold   LAQ with band 2x10 blue         Exercises - Modified Thomas Stretch  - 1 x daily - 7 x weekly - 3 sets - 10 reps - The Diver  - 1 x daily - 7 x weekly - 3 sets - 10 reps - Side Stepping with Resistance at Thighs  - 1 x daily - 7 x weekly - 3 sets - 10 reps - 3-Way Lunge  - 1 x daily - 7 x weekly - 3 sets - 10 reps  Patient also asked about wall sits and hamstring stretches. He was advised both of thos exercises would be great for him   PATIENT EDUCATION:  Education details: reviewed HEp and symptom management  Person educated: Patient Education method: Explanation, Demonstration, Tactile cues, Verbal cues, and Handouts Education comprehension: verbalized understanding and returned demonstration   HOME EXERCISE PROGRAM: Access Code: [email or web address] URL: [email or web address] Date: 08/29/2021 Prepared by: Carolyne Littles  Exercises - Modified Marcello Moores Stretch  - 1 x daily - 7 x weekly - 3 sets - 10 reps - The Diver  - 1 x daily - 7 x weekly - 3 sets - 10 reps - Side Stepping with Resistance at Thighs  - 1 x daily - 7 x weekly - 3 sets - 10 reps - 3-Way Lunge  - 1 x daily - 7 x weekly - 3 sets - 10 reps  ASSESSMENT:  CLINICAL IMPRESSION: Therapy could not find any trigger points in the muscle. He had no soreness with palpation. He could feel it with his instability exercises and a little with the jumping exercise. He was advised to do his exercises for the next few weeks and see if it happens again. It is tough to see if we are making progress because it only happens randomly. He would like to try ju-jitsu. He was advised that at long as it doesn't hirt his knee he should be fine. He will schedule a visit as needed.   OBJECTIVE IMPAIRMENTS Abnormal gait, decreased activity tolerance, and pain.   ACTIVITY LIMITATIONS  when  he is inflamed stairs and daily activity is limited  .   PERSONAL FACTORS None   REHAB POTENTIAL: Excellent  CLINICAL DECISION MAKING: Stable/uncomplicated  EVALUATION COMPLEXITY: Low   GOALS: Goals reviewed with patient? Yes  SHORT TERM GOALS: Target date: 10/12/2021  Patient will obtain shoes that have good support  Baseline: Goal status: INITIAL  2.  Patient will improve single leg stance time to 30 seconds without  excessive supination of the foot  Baseline:  Goal status: INITIAL  3.  Patient will go 3 weeks without flair-up  Baseline:  Goal status: INITIAL   LONG TERM GOALS: Target date: 11/02/2021  Patient will have a full gym program that does not hurt his knee or cause an exacerbation  Baseline:  Goal status: INITIAL  2.  Patient will go up/down the steps without  Baseline:  Goal status: INITIAL   PLAN: PT FREQUENCY: 1x/week  PT DURATION: 6 weeks  PLANNED INTERVENTIONS: Therapeutic exercises, Therapeutic activity, Neuromuscular re-education, Balance training, Gait training, Patient/Family education, Joint mobilization, Stair training, Dry Needling, Ultrasound, and Manual therapy  PLAN FOR NEXT SESSION: review how the patient did with his exercises. Progress single leg stance activity. Progress banded short movement activity as tolerated.   PHYSICAL THERAPY DISCHARGE SUMMARY  Visits from Start of Care: 3  Current functional level related to goals / functional outcomes: Decreased frequency of pain. Has a full HEP    Remaining deficits: Pain at times but less frequent    Education / Equipment: HEP   Patient agrees to discharge. Patient goals were met. Patient is being discharged due to meeting the stated rehab goals.   Carney Living, PT 09/21/2021, 9:13 AM

## 2021-09-21 ENCOUNTER — Encounter (HOSPITAL_BASED_OUTPATIENT_CLINIC_OR_DEPARTMENT_OTHER): Payer: Self-pay | Admitting: Physical Therapy

## 2021-09-25 ENCOUNTER — Encounter (HOSPITAL_BASED_OUTPATIENT_CLINIC_OR_DEPARTMENT_OTHER): Payer: Self-pay

## 2021-09-25 ENCOUNTER — Other Ambulatory Visit: Payer: Self-pay

## 2021-09-25 ENCOUNTER — Emergency Department (HOSPITAL_BASED_OUTPATIENT_CLINIC_OR_DEPARTMENT_OTHER)
Admission: EM | Admit: 2021-09-25 | Discharge: 2021-09-25 | Disposition: A | Discharge status: Home / Self Care | Payer: Managed Care, Other (non HMO) | Attending: Emergency Medicine | Admitting: Emergency Medicine

## 2021-09-25 ENCOUNTER — Emergency Department (HOSPITAL_BASED_OUTPATIENT_CLINIC_OR_DEPARTMENT_OTHER): Payer: Managed Care, Other (non HMO) | Admitting: Radiology

## 2021-09-25 DIAGNOSIS — Z23 Encounter for immunization: Secondary | ICD-10-CM | POA: Insufficient documentation

## 2021-09-25 DIAGNOSIS — Z7951 Long term (current) use of inhaled steroids: Secondary | ICD-10-CM | POA: Insufficient documentation

## 2021-09-25 DIAGNOSIS — S62336A Displaced fracture of neck of fifth metacarpal bone, right hand, initial encounter for closed fracture: Secondary | ICD-10-CM | POA: Insufficient documentation

## 2021-09-25 DIAGNOSIS — Y990 Civilian activity done for income or pay: Secondary | ICD-10-CM | POA: Diagnosis not present

## 2021-09-25 DIAGNOSIS — J45909 Unspecified asthma, uncomplicated: Secondary | ICD-10-CM | POA: Diagnosis not present

## 2021-09-25 DIAGNOSIS — Z7982 Long term (current) use of aspirin: Secondary | ICD-10-CM | POA: Insufficient documentation

## 2021-09-25 DIAGNOSIS — W228XXA Striking against or struck by other objects, initial encounter: Secondary | ICD-10-CM | POA: Diagnosis not present

## 2021-09-25 DIAGNOSIS — S6991XA Unspecified injury of right wrist, hand and finger(s), initial encounter: Secondary | ICD-10-CM | POA: Diagnosis present

## 2021-09-25 DIAGNOSIS — S62339A Displaced fracture of neck of unspecified metacarpal bone, initial encounter for closed fracture: Secondary | ICD-10-CM

## 2021-09-25 MED ORDER — HYDROCODONE-ACETAMINOPHEN 5-325 MG PO TABS: 2.0000 | ORAL_TABLET | ORAL | 0 refills | Status: AC | PRN

## 2021-09-25 MED ORDER — TETANUS-DIPHTH-ACELL PERTUSSIS 5-2.5-18.5 LF-MCG/0.5 IM SUSY
0.5000 mL | PREFILLED_SYRINGE | Freq: Once | INTRAMUSCULAR | Status: AC
  Administered 2021-09-25: 0.5 mL via INTRAMUSCULAR
  Filled 2021-09-25: qty 0.5

## 2021-09-25 NOTE — ED Provider Notes (Signed)
?[facility] EMERGENCY DEPT ?Provider Note ? ? ?CSN: [id number] ?Arrival date & time: 09/25/21  1058 ? ?  ? ?History ?Chief Complaint  ?Patient presents with  ? Hand Injury  ? ? ?Jaycub Noorani is a 41 y.o. male with history of asthma presents the emergency department for evaluation of right hand pain.  Today, the patient was working in and old wood barn and he punched a door thinking that he could demolish it.  He was experiencing pain after that.  He reports he thinks his tetanus was around 10 years ago.  He denies any numbness or tingling to the area.  Denies any previous injury to the hand.  He is right-hand dominant. He currently takes 325mg  of ASA daily.  ? ? ?Hand Injury ? ?  ? ?Home Medications ?Prior to Admission medications   ?Medication Sig Start Date End Date Taking? Authorizing Provider  ?HYDROcodone-acetaminophen (NORCO/VICODIN) 5-325 MG tablet Take 2 tablets by mouth every 4 (four) hours as needed. 09/25/21  Yes 09/27/21, PA-C  ?albuterol (PROVENTIL HFA;VENTOLIN HFA) 108 (90 Base) MCG/ACT inhaler Inhale 2 puffs into the lungs as needed.  05/15/18   [provider]  ?aspirin 325 MG tablet Take 325 mg by mouth daily.    [provider]  ?Budesonide ER 6 MG CP24 ADD 07/14/18 (ONE DOSE) OF MEDICATION TO OF SALINE IN SINUS RINSE BOTTLE. IRRIGATE SINUSES WITH THROUGH EACH NOSTRIL TWICE DAILY 01/14/20   [provider]  ?fluticasone-salmeterol (ADVAIR HFA) 115-21 MCG/ACT inhaler Inhale 2 puffs into the lungs 2 (two) times daily.    [provider]  ?triamcinolone cream (KENALOG) 0.1 % Apply to affected area 1-2 times daily 06/27/21   06/29/21, PA  ?   ? ?Allergies    ?Aspirin   ? ?Review of Systems   ?Review of Systems  ?Musculoskeletal:  Positive for arthralgias and myalgias.  ?Skin:  Positive for wound.  ? ?Physical Exam ?Updated Vital Signs ?BP (!) 140/93   Pulse 68   Temp 97.8 ?F (36.6 ?C) (Oral)   Resp 16   Ht 5\' 11"  (1.803 m)   Wt  83.9 kg   SpO2 98%   BMI 25.80 kg/m?  ?Physical Exam ?Vitals and nursing note reviewed.  ?Constitutional:   ?   General: He is not in acute distress. ?   Appearance: Normal appearance. He is not toxic-appearing.  ?Eyes:  ?   General: No scleral icterus. ?Pulmonary:  ?   Effort: Pulmonary effort is normal. No respiratory distress.  ?Musculoskeletal:     ?   General: Tenderness and signs of injury present.  ?   Comments: Superficial abrasions noted to the PIP of the third and fourth finger.  He does have a very superficial abrasion noted in between the fourth and fifth finger on the dorsal side.  Fracture is proximal to this superficial abrasion. He does have some tenderness to the ulnar side of his hand. No snuffbox tenderness. Cap refill intact. Radial pulse intact. Compartments are soft.   ?Skin: ?   General: Skin is dry.  ?   Findings: No rash.  ?Neurological:  ?   General: No focal deficit present.  ?   Mental Status: He is alert. Mental status is at baseline.  ?Psychiatric:     ?   Mood and Affect: Mood normal.  ? ? ? ? ? ?ED Results / Procedures / Treatments   ?Labs ?(all labs ordered are listed, but only abnormal results are displayed) ?  Labs Reviewed - No data to display ? ?EKG ?None ? ?Radiology ?DG Hand Complete Right ? ?Result Date: 09/25/2021 ?CLINICAL DATA:  Right hand pain after injury. EXAM: RIGHT HAND - COMPLETE 3+ VIEW COMPARISON:  None Available. FINDINGS: Moderately displaced proximal fifth metacarpal fracture is noted. Joint spaces are intact. IMPRESSION: Moderately displaced proximal fifth metacarpal fracture. Electronically Signed   By: Lupita Raider M.D.   On: 09/25/2021 11:48   ? ?Procedures ?Procedures  ? ?Medications Ordered in ED ?Medications  ?Tdap (BOOSTRIX) injection 0.5 mL (0.5 mLs Intramuscular Given 09/25/21 1207)  ? ? ?ED Course/ Medical Decision Making/ A&P ?  ?                        ?Medical Decision Making ?Amount and/or Complexity of Data Reviewed ?Radiology:  ordered. ? ?Risk ?Prescription drug management. ? ? ? ?41 year old male presents the emergency department for evaluation of right hand pain.  Differential diagnosis includes but is not limited to sprain, strain, fracture, dislocation, contusion.  Vital signs show mildly elevated blood pressure 140/93, afebrile, normal pulse rate, satting well on room air without any increased work of breathing.  Physical exam is pertinent for superficial abrasions noted to the PIP of the third and fourth finger.  He does have a very superficial abrasion noted in between the fourth and fifth finger on the dorsal side.  Fracture is proximal to this superficial abrasion. He does have some tenderness to the ulnar side of his hand. No snuffbox tenderness. Cap refill intact. Radial pulse intact. Compartments are soft. ? ?The patient reports it may have been longer than 10 years for his last tetanus. Will update today.  ? ?I independently reviewed and interpreted the patient's imaging and agree with the radiologist interpretation.  There is a moderately displaced proximal fifth metacarpal fracture noted.  The joint spaces are intact.  Consistent with boxer's fracture. ? ?Given the boxer's fracture, will place patient in an ulnar gutter splint. Cap refill and sensation intact after splint placement.  ? ?Follow-up for hand surgery given.  Patient does take aspirin daily, however will prescribe him some Vicodin to go home with for breakthrough pain.  PDMP checked prior. I stressed the importance of following up with hand surgery to the patient and wife in the room. We discussed red flag symptoms and strict return precautions. They verbalize understanding and agree to the plan. The patient is stable and being discharged home in good condition.  ? ? ?Final Clinical Impression(s) / ED Diagnoses ?Final diagnoses:  ?Closed boxer's fracture, initial encounter  ? ? ?Rx / DC Orders ?ED Discharge Orders   ? ?      Ordered  ?  HYDROcodone-acetaminophen  (NORCO/VICODIN) 5-325 MG tablet  Every 4 hours PRN       ? 09/25/21 1223  ? ?  ?  ? ?  ? ? ?  ?Achille Rich, PA-C ?09/25/21 1258 ? ?  ?Margarita Grizzle, MD ?09/26/21 1020 ? ?

## 2021-09-25 NOTE — Discharge Instructions (Addendum)
You were seen in the ER today for evaluation of your right hand pain. It was discovered that you have a fracture in that hand that will need to be seen by a hand surgeon immediately. Please call Dr. Carollee Massed office to schedule an appointment. I have prescribed you a few narcotic pain pills if needed for breakthrough pain. Please do not drive or operate heavy machinery while on this medication. If you have any concern, new or worsening symptom, please return to the ER.  ? ?Contact a health care provider if: ?Your pain is getting worse. ?You have more redness, swelling, or pain in the injured area. ?You have a fever. ?Your cast or splint feels too tight or too loose. ?Your cast is coming apart or breaking. ?You develop a rash. ?Get help right away if: ?You have severe pain. ?Your skin or nails on your injured hand turn blue or gray even after you loosen your splint. ?Your injured hand feels cold or becomes numb even after you loosen your splint. ?

## 2021-09-25 NOTE — Progress Notes (Signed)
? ?  I, Christoper Fabian, LAT, ATC, am serving as scribe for Dr. Clementeen Graham. ? ?Kimberley Dastrup is a 41 y.o. male who presents to [facility] at [facility] today for f/u of L knee pain w/ unclear etiology.  He was last seen by Dr. Denyse Amass on 08/01/21 and was referred to PT of which he's completed 3 visits.  Today, pt that his L knee has been feeling ok.  He has been doing his HEP prescribed by PT.  Since his last visit, he fractured his R 5th proximal MC.  He was seen in the emergency department and treated with an ulnar gutter splint.  He has an appointment already scheduled with Dr. Janee Morn at [facility] orthopedics tomorrow. ? ?Diagnostic testing: L knee XR- 08/01/21 ? ?Pertinent review of systems: No fevers or chills ? ?Relevant historical information: GERD. ? ? ?Exam:  ?BP 110/80 (BP Location: Left Arm, Patient Position: Sitting, Cuff Size: Normal)   Pulse 70   Ht 5\' 11"  (1.803 m)   Wt 188 lb 6.4 oz (85.5 kg)   SpO2 96%   BMI 26.28 kg/m?  ?General: Well Developed, well nourished, and in no acute distress.  ? ?MSK: Left knee: Normal motion normal gait. ? ?Right hand well-appearing ulnar gutter splint.  Distal fingertips normal capillary refill ? ? ? ?Lab and Radiology Results ?No results found for this or any previous visit (from the past 72 hour(s)). ?DG Hand Complete Right ? ?Result Date: 09/25/2021 ?CLINICAL DATA:  Right hand pain after injury. EXAM: RIGHT HAND - COMPLETE 3+ VIEW COMPARISON:  None Available. FINDINGS: Moderately displaced proximal fifth metacarpal fracture is noted. Joint spaces are intact. IMPRESSION: Moderately displaced proximal fifth metacarpal fracture. Electronically Signed   By: 09/27/2021 M.D.   On: 09/25/2021 11:48   ? ?I, 09/27/2021, personally (independently) visualized and performed the interpretation of the images attached in this note. ? ? ? ?Assessment and Plan: ?41 y.o. 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 ?Jaqualyn has improved quite a bit with therapy.  He is transition to independent exercises.  Watchful waiting from my point.  Certainly could do more including MRI if needed.  However he is doing too well at this point for that level of evaluation. ? ?As for his hand fracture he already has a scheduled appointment with a hand surgeon tomorrow.  This fracture is unstable and likely will require surgery.  Happy to be a backup plan but I think he has a good treatment plan currently.  Recheck back with me as needed. ? ? ? ? ?Discussed warning signs or symptoms. Please see discharge instructions. Patient expresses understanding. ? ? ?The above documentation has been reviewed and is accurate and complete 45, M.D. ? ? ?

## 2021-09-25 NOTE — ED Triage Notes (Signed)
Pt states that he was trying to knock down an old wooden barn door and injured his R hand. Pt has abrasions to his third and fourth fingers on his knuckles. Pt states he is UTD on tetanus. No other injuries noted.  ?

## 2021-09-26 ENCOUNTER — Ambulatory Visit (INDEPENDENT_AMBULATORY_CARE_PROVIDER_SITE_OTHER): Payer: Managed Care, Other (non HMO) | Admitting: Family Medicine

## 2021-09-26 ENCOUNTER — Encounter: Payer: Self-pay | Admitting: Family Medicine

## 2021-09-26 ENCOUNTER — Telehealth: Payer: Self-pay | Admitting: Family Medicine

## 2021-09-26 VITALS — BP 110/80 | HR 70 | Ht 71.0 in | Wt 188.4 lb

## 2021-09-26 DIAGNOSIS — S62326A Displaced fracture of shaft of fifth metacarpal bone, right hand, initial encounter for closed fracture: Secondary | ICD-10-CM

## 2021-09-26 DIAGNOSIS — G8929 Other chronic pain: Secondary | ICD-10-CM

## 2021-09-26 DIAGNOSIS — M25562 Pain in left knee: Secondary | ICD-10-CM

## 2021-09-26 NOTE — Telephone Encounter (Signed)
Patient called asking if we could send him the hand surgeon recommendations that Dr Denyse Amass mentioned through MyChart? ? ?Please advise. ? ?

## 2021-09-26 NOTE — Telephone Encounter (Signed)
Spoke with patient for clarification.  Referral placed for Dr. Tempie Donning at [facility]. ?

## 2021-09-26 NOTE — Patient Instructions (Signed)
Thank you for coming in today.  ? ?Continue exercise.  ? ?Recheck with me as needed.  ? ?Let me know if you have any issues at [facility].  ?

## 2021-10-02 ENCOUNTER — Ambulatory Visit: Payer: Managed Care, Other (non HMO) | Admitting: Orthopedic Surgery

## 2022-01-02 IMAGING — DX DG HIP (WITH OR WITHOUT PELVIS) 2-3V*R*
2 series · 2 of 2 positions shown · non-contrast
Comparison: None.

CLINICAL DATA: Right hip and groin pain for 2 months. No known
injury.

EXAM:
DG HIP (WITH OR WITHOUT PELVIS) 2-3V RIGHT

[hip ap]
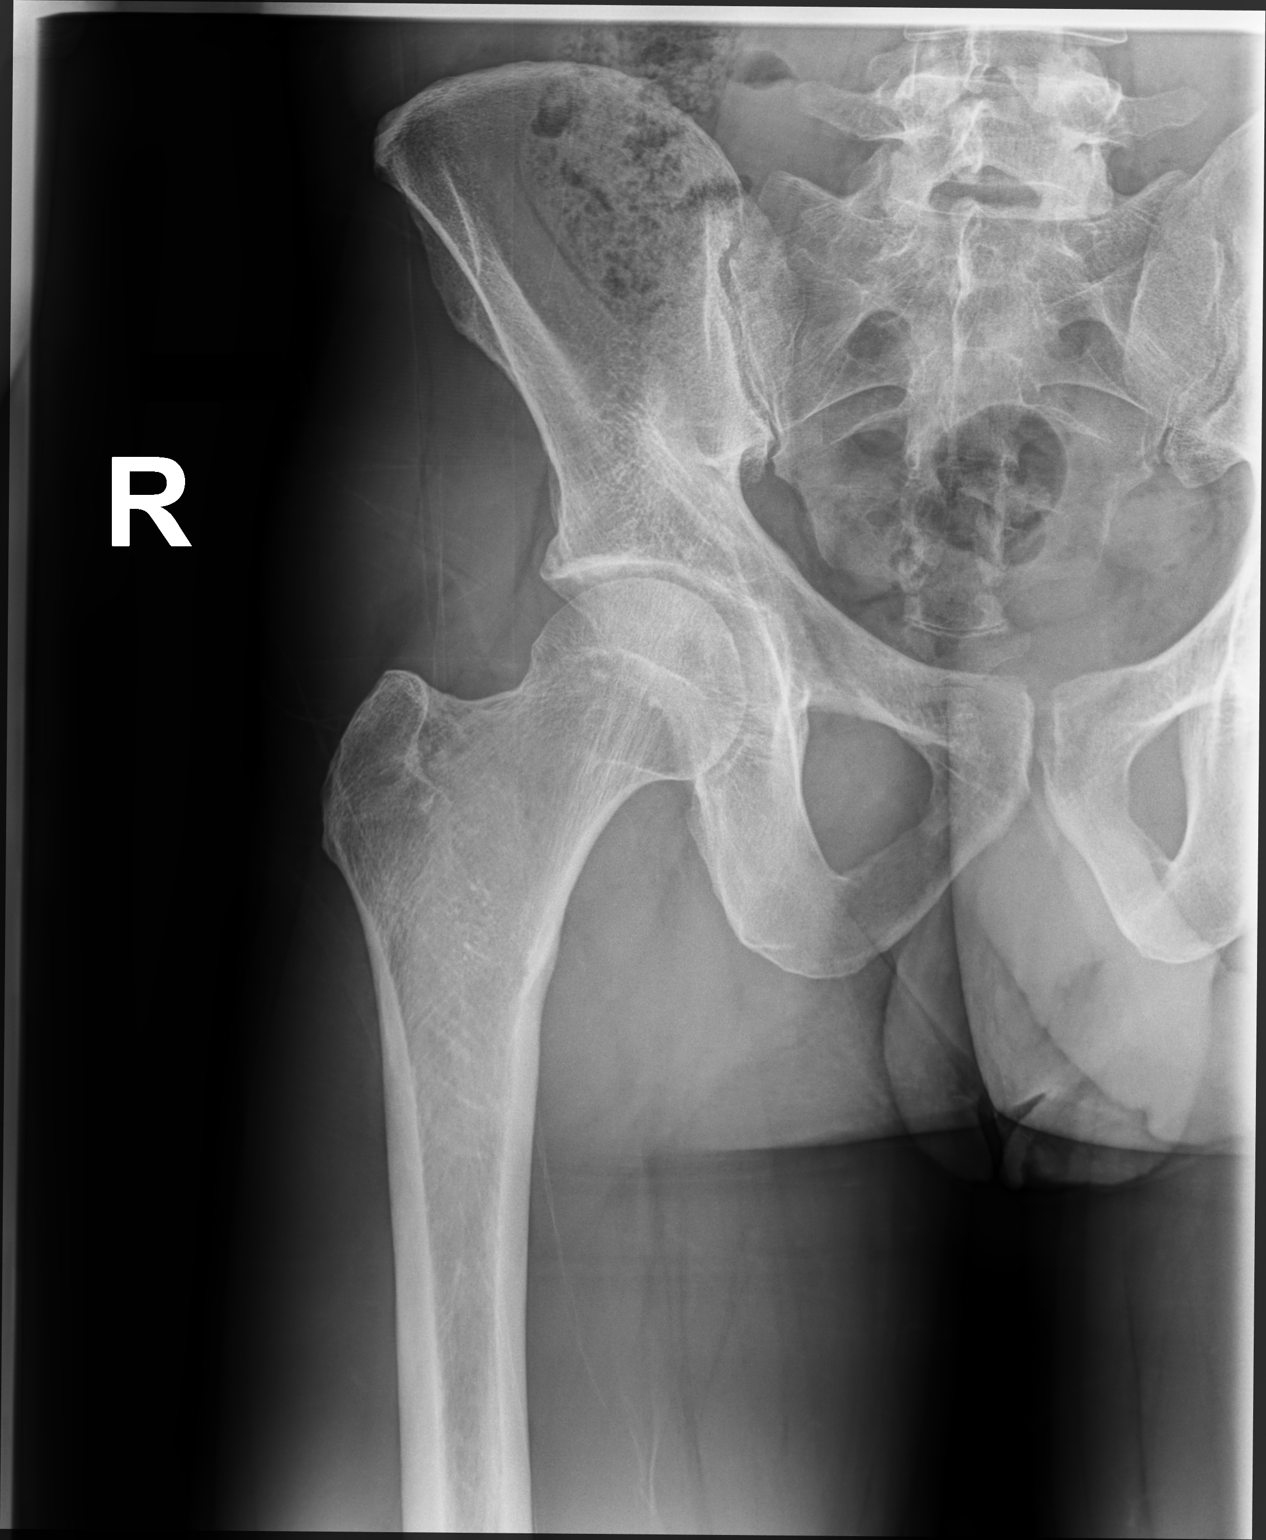

[hip (frog leg)]
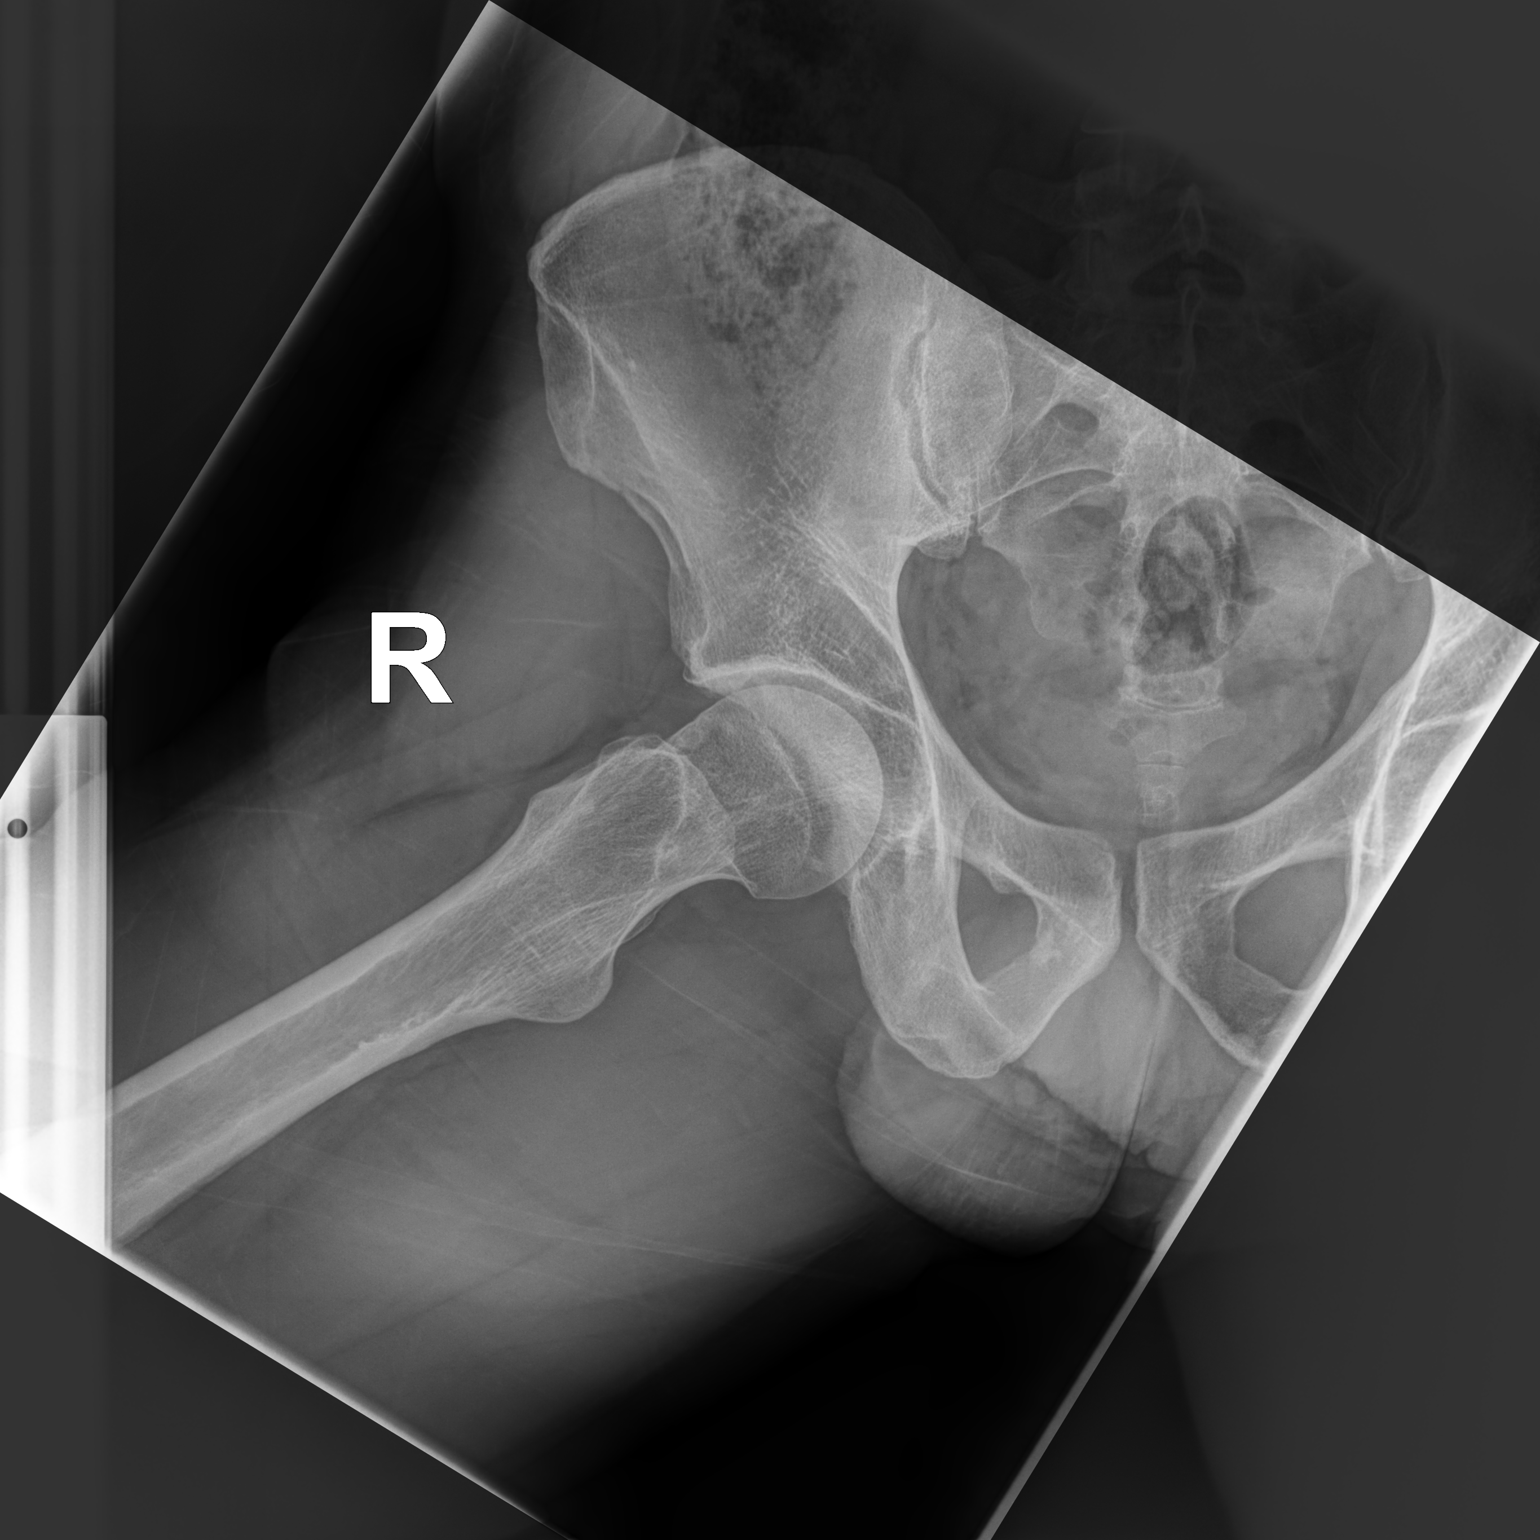

[2 of 2 positions shown; findings below may reference images not displayed]

FINDINGS: There is no evidence of hip fracture or dislocation. There is no
evidence of arthropathy or other focal bone abnormality.
IMPRESSION: Normal exam.

## 2022-02-04 ENCOUNTER — Encounter: Payer: Self-pay | Admitting: *Deleted

## 2022-02-19 ENCOUNTER — Ambulatory Visit: Payer: Managed Care, Other (non HMO) | Admitting: Physician Assistant

## 2022-04-25 ENCOUNTER — Encounter: Payer: Self-pay | Admitting: *Deleted

## 2022-10-31 ENCOUNTER — Other Ambulatory Visit: Payer: Self-pay | Admitting: *Deleted

## 2022-10-31 ENCOUNTER — Telehealth: Payer: Managed Care, Other (non HMO) | Admitting: Physician Assistant

## 2022-11-01 ENCOUNTER — Telehealth: Payer: Managed Care, Other (non HMO) | Admitting: Physician Assistant

## 2022-11-01 DIAGNOSIS — F418 Other specified anxiety disorders: Secondary | ICD-10-CM | POA: Diagnosis not present

## 2022-11-01 MED ORDER — BUSPIRONE HCL 10 MG PO TABS: 10.0000 mg | ORAL_TABLET | Freq: Three times a day (TID) | ORAL | 1 refills | Status: AC | PRN

## 2022-11-01 NOTE — Progress Notes (Signed)
Virtual Visit via Video Note   I, Jarold Motto, connected with  Douglas Powell  ([id number], 25-Aug-1980) on 11/01/22 at  1:00 PM EDT by a video-enabled telemedicine application and verified that I am speaking with the correct person using two identifiers.  Location: Patient: Home Provider: [place] office   I discussed the limitations of evaluation and management by telemedicine and the availability of in person appointments. The patient expressed understanding and agreed to proceed.    History of Present Illness: Douglas Powell is a 42 y.o. who identifies as a male who was assigned male at birth, and is being seen today for situational anxiety.  Having some situational stress with kids, relationships, job. Sees a counselor twice a month and discussed possibly starting medication -- he is reaching out to today to trial this. He would like something as needed, does not want to take something long term if unnecessary.  Denies SI/HI  Problems:  Patient Active Problem List   Diagnosis Date Noted   Non-seasonal allergic rhinitis 02/24/2020   Aspirin-exacerbated respiratory disease (AERD) 02/24/2020   Prepatellar bursitis of both knees 07/15/2019   Right hip pain 07/15/2019   Palpitations 09/04/2018   SOB (shortness of breath) 09/04/2018   GERD (gastroesophageal reflux disease)    Asthma    Mitral valve insufficiency 05/14/2015   History of palpitations 05/13/2010    Allergies:  Allergies  Allergen Reactions   Aspirin Other (See Comments)    Pt can not have more than 325 mg a day.   Medications:  Current Outpatient Medications:    albuterol (PROVENTIL HFA;VENTOLIN HFA) 108 (90 Base) MCG/ACT inhaler, Inhale 2 puffs into the lungs as needed. , Disp: , Rfl:    Budesonide ER 6 MG CP24, ADD (ONE DOSE) OF MEDICATION TO OF SALINE IN SINUS RINSE BOTTLE. IRRIGATE SINUSES WITH THROUGH EACH NOSTRIL TWICE DAILY, Disp: , Rfl:    busPIRone (BUSPAR) 10  MG tablet, Take 1 tablet (10 mg total) by mouth 3 (three) times daily as needed (anxiety)., Disp: 60 tablet, Rfl: 1   fluticasone-salmeterol (ADVAIR HFA) 115-21 MCG/ACT inhaler, Inhale 2 puffs into the lungs 2 (two) times daily., Disp: , Rfl:    aspirin 325 MG tablet, Take 325 mg by mouth daily., Disp: , Rfl:    HYDROcodone-acetaminophen (NORCO/VICODIN) 5-325 MG tablet, Take 2 tablets by mouth every 4 (four) hours as needed. (Patient not taking: Reported on 09/26/2021), Disp: 10 tablet, Rfl: 0   pantoprazole (PROTONIX) 40 MG tablet, Take 1 tablet by mouth daily., Disp: , Rfl:    triamcinolone cream (KENALOG) 0.1 %, Apply to affected area 1-2 times daily, Disp: 80 g, Rfl: 0  Observations/Objective: Patient is well-developed, well-nourished in no acute distress.  Resting comfortably  at home.  Head is normocephalic, atraumatic.  No labored breathing.  Speech is clear and coherent with logical content.  Patient is alert and oriented at baseline.   Assessment and Plan: 1. Situational anxiety New Discussed medication options Discussed buspar 10 mg three times daily as needed for anxiety -- reviewed risks/benefits/side effect(s) Follow-up via MyChart in a few weeks to touch base on this, sooner if concerns I discussed with patient that if they develop any SI, to tell someone immediately and seek medical attention. Denies SI/HI on my exam and discussion today   Follow Up Instructions: I discussed the assessment and treatment plan with the patient. The patient was provided an opportunity to ask questions and all were answered. The patient  agreed with the plan and demonstrated an understanding of the instructions.  A copy of instructions were sent to the patient via MyChart unless otherwise noted below.   The patient was advised to call back or seek an in-person evaluation if the symptoms worsen or if the condition fails to improve as anticipated.  Jarold Motto, [place]

## 2022-11-16 ENCOUNTER — Emergency Department (HOSPITAL_BASED_OUTPATIENT_CLINIC_OR_DEPARTMENT_OTHER): Payer: Managed Care, Other (non HMO) | Admitting: Radiology

## 2022-11-16 ENCOUNTER — Encounter (HOSPITAL_BASED_OUTPATIENT_CLINIC_OR_DEPARTMENT_OTHER): Payer: Self-pay

## 2022-11-16 ENCOUNTER — Other Ambulatory Visit: Payer: Self-pay

## 2022-11-16 ENCOUNTER — Emergency Department (HOSPITAL_BASED_OUTPATIENT_CLINIC_OR_DEPARTMENT_OTHER)
Admission: EM | Admit: 2022-11-16 | Discharge: 2022-11-16 | Disposition: A | Discharge status: Home / Self Care | Payer: Managed Care, Other (non HMO) | Attending: Emergency Medicine | Admitting: Emergency Medicine

## 2022-11-16 DIAGNOSIS — M546 Pain in thoracic spine: Secondary | ICD-10-CM | POA: Diagnosis present

## 2022-11-16 DIAGNOSIS — Z7982 Long term (current) use of aspirin: Secondary | ICD-10-CM | POA: Insufficient documentation

## 2022-11-16 DIAGNOSIS — R079 Chest pain, unspecified: Secondary | ICD-10-CM | POA: Insufficient documentation

## 2022-11-16 LAB — BASIC METABOLIC PANEL
Anion gap: 8 (ref 5–15)
BUN: 16 mg/dL (ref 6–20)
CO2: 26 mmol/L (ref 22–32)
Calcium: 8.8 mg/dL — ABNORMAL LOW (ref 8.9–10.3)
Chloride: 105 mmol/L (ref 98–111)
Creatinine, Ser: 1.04 mg/dL (ref 0.61–1.24)
GFR, Estimated: >60 mL/min (ref >60)
Glucose, Bld: 137 mg/dL — ABNORMAL HIGH (ref 70–99)
Potassium: 3.6 mmol/L (ref 3.5–5.1)
Sodium: 139 mmol/L (ref 135–145)

## 2022-11-16 LAB — CBC
HCT: 43.9 % (ref 39.0–52.0)
Hemoglobin: 15.5 g/dL (ref 13.0–17.0)
MCH: 31.6 pg (ref 26.0–34.0)
MCHC: 35.3 g/dL (ref 30.0–36.0)
MCV: 89.6 fL (ref 80.0–100.0)
Platelets: 259 10*3/uL (ref 150–400)
RBC: 4.9 MIL/uL (ref 4.22–5.81)
RDW: 12.2 % (ref 11.5–15.5)
WBC: 8.9 10*3/uL (ref 4.0–10.5)
nRBC: 0 % (ref 0.0–0.2)

## 2022-11-16 LAB — TROPONIN I (HIGH SENSITIVITY): Troponin I (High Sensitivity): 2 ng/L (ref <18)

## 2022-11-16 NOTE — ED Triage Notes (Signed)
Pt arrived POV for back pain that radiates into his chest, pt reports was watching TV when pain occurred, took Tylenol 1 hr PTA w/minium improvement. Denies sob, reports nausea before the back pain occurred. Denies pain at current, NAD noted VSS, A&O x4.

## 2022-11-16 NOTE — ED Provider Notes (Signed)
[place] EMERGENCY DEPARTMENT AT [facility] Provider Note   CSN: [id number] Arrival date & time: 11/16/22  0130     History  Chief Complaint  Patient presents with   Chest Pain    Douglas Powell is a 42 y.o. male.  42 year old male who presents ER today with severe back pain.  Patient states that he was getting ready for bed he had his gradual onset of mid thoracic back pain that was 10 out of 10 unlike anything is ever felt before.  It went away but then came back and persisted.  Tried a couple Tylenol and that any better.  Tried stretching, movement, massage and heat did not help.  Patient was concerned so came here for further evaluation.  Patient is asymptomatic at this time.  Patient stated he might of had a little bit of preceding nausea but no emesis, diaphoresis, dyspnea or lightheadedness.  No injuries to his back.  No history of back issues.  No history of pancreas or gallbladder issues.  Last eaten about 2 to 3 hours prior to this starting and no history of indigestion.  Does not smoke or use drugs and only drinks call occasionally.   Chest Pain      Home Medications Prior to Admission medications   Medication Sig Start Date End Date Taking? Authorizing Provider  albuterol (PROVENTIL HFA;VENTOLIN HFA) 108 (90 Base) MCG/ACT inhaler Inhale 2 puffs into the lungs as needed.  05/15/18   [provider]  aspirin 325 MG tablet Take 325 mg by mouth daily.    [provider]  Budesonide ER 6 MG CP24 ADD (ONE DOSE) OF MEDICATION TO OF SALINE IN SINUS RINSE BOTTLE. IRRIGATE SINUSES WITH THROUGH EACH NOSTRIL TWICE DAILY 01/14/20   [provider]  busPIRone (BUSPAR) 10 MG tablet Take 1 tablet (10 mg total) by mouth 3 (three) times daily as needed (anxiety). 11/01/22   Jarold Motto, PA  fluticasone-salmeterol (ADVAIR HFA) [phone] MCG/ACT inhaler Inhale 2 puffs into the lungs 2 (two) times daily.    [provider]   HYDROcodone-acetaminophen (NORCO/VICODIN) 5-325 MG tablet Take 2 tablets by mouth every 4 (four) hours as needed. Patient not taking: Reported on 09/26/2021 09/25/21   Achille Rich, PA-C  pantoprazole (PROTONIX) 40 MG tablet Take 1 tablet by mouth daily. 02/10/20   [provider]  triamcinolone cream (KENALOG) 0.1 % Apply to affected area 1-2 times daily 06/27/21   Jarold Motto, PA      Allergies    Aspirin    Review of Systems   Review of Systems  Cardiovascular:  Positive for chest pain.    Physical Exam Updated Vital Signs BP 127/81   Pulse 71   Temp 98 F (36.7 C) (Oral)   Resp 12   Ht 5\' 11"  (1.803 m)   Wt 83.9 kg   SpO2 98%   BMI 25.80 kg/m  Physical Exam Vitals and nursing note reviewed.  Constitutional:      Appearance: He is well-developed.  HENT:     Head: Normocephalic and atraumatic.  Cardiovascular:     Rate and Rhythm: Normal rate.  Pulmonary:     Effort: Pulmonary effort is normal. No respiratory distress.  Abdominal:     General: There is no distension.  Musculoskeletal:        General: Normal range of motion.     Cervical back: Normal range of motion.     Thoracic back: No deformity or tenderness.  Neurological:  Mental Status: He is alert.     ED Results / Procedures / Treatments   Labs (all labs ordered are listed, but only abnormal results are displayed) Labs Reviewed  BASIC METABOLIC PANEL - Abnormal; Notable for the following components:      Result Value   Glucose, Bld 137 (*)    Calcium 8.8 (*)    All other components within normal limits  CBC  TROPONIN I (HIGH SENSITIVITY)    EKG EKG Interpretation Date/Time:  Saturday November 16 2022 01:36:40 EDT Ventricular Rate:  74 PR Interval:  150 QRS Duration:  96 QT Interval:  352 QTC Calculation: 391 R Axis:   44  Text Interpretation: Sinus rhythm Low voltage, precordial leads ST elev, probable normal early repol pattern Confirmed by Marily Memos [phone]) on 11/16/2022  2:11:39 AM  Radiology DG Thoracic Spine 4V  Result Date: 11/16/2022 CLINICAL DATA:  Back pain, no known injury. EXAM: THORACIC SPINE - 4+ VIEW COMPARISON:  None Available. FINDINGS: There is no evidence of thoracic spine fracture. Alignment is normal. No other significant bone abnormalities are identified. IMPRESSION: Negative. Electronically Signed   By: Charlett Nose M.D.   On: 11/16/2022 03:29   DG Chest 2 View  Result Date: 11/16/2022 CLINICAL DATA:  Chest pain, back pain EXAM: CHEST - 2 VIEW COMPARISON:  None Available. FINDINGS: The heart size and mediastinal contours are within normal limits. Both lungs are clear. The visualized skeletal structures are unremarkable. IMPRESSION: No active cardiopulmonary disease. Electronically Signed   By: Charlett Nose M.D.   On: 11/16/2022 02:05    Procedures Procedures    Medications Ordered in ED Medications - No data to display  ED Course/ Medical Decision Making/ A&P                             Medical Decision Making Amount and/or Complexity of Data Reviewed Labs: ordered. Radiology: ordered.  Patient symptoms sound like a peripheral illogic in nature.  Although does not make any sense without any back issues or tenderness in his back and no recent injuries.  Unlikely cardiac with his description of symptoms, normal EKG and negative troponin.  Consider possible dissection however with his dermatomal type of pain, no hypertension, no other risk factors think this is unlikely as well.  Doubt pancreatic or hepatobiliary issues without history or risk factors for the same.  Will go and add on a thoracic x-ray to ensure there is no intervertebral narrowing or vertebral injury.  If he stays asymptomatic and this is normal I think he can follow-up with his PCP.  Final Clinical Impression(s) / ED Diagnoses Final diagnoses:  Acute bilateral thoracic back pain    Rx / DC Orders ED Discharge Orders     None         Keairra Bardon, Barbara Cower,  MD 11/16/22 [id number]

## 2023-07-02 ENCOUNTER — Institutional Professional Consult (permissible substitution) (INDEPENDENT_AMBULATORY_CARE_PROVIDER_SITE_OTHER): Payer: Managed Care, Other (non HMO)

## 2023-12-08 ENCOUNTER — Emergency Department (HOSPITAL_BASED_OUTPATIENT_CLINIC_OR_DEPARTMENT_OTHER): Admission: EM | Admit: 2023-12-08 | Discharge: 2023-12-08 | Disposition: A | Discharge status: Home / Self Care

## 2023-12-08 ENCOUNTER — Encounter (HOSPITAL_BASED_OUTPATIENT_CLINIC_OR_DEPARTMENT_OTHER): Payer: Self-pay | Admitting: Emergency Medicine

## 2023-12-08 ENCOUNTER — Other Ambulatory Visit: Payer: Self-pay

## 2023-12-08 DIAGNOSIS — I809 Phlebitis and thrombophlebitis of unspecified site: Secondary | ICD-10-CM | POA: Insufficient documentation

## 2023-12-08 DIAGNOSIS — M79604 Pain in right leg: Secondary | ICD-10-CM | POA: Diagnosis present

## 2023-12-08 DIAGNOSIS — Z7982 Long term (current) use of aspirin: Secondary | ICD-10-CM | POA: Insufficient documentation

## 2023-12-08 LAB — D-DIMER, QUANTITATIVE: D-Dimer, Quant: <0.27 ug{FEU}/mL (ref 0.00–0.50)

## 2023-12-08 NOTE — ED Provider Notes (Addendum)
 [place] EMERGENCY DEPARTMENT AT [facility] Provider Note   CSN: [id number] Arrival date & time: 12/08/23  9170     Patient presents with: Leg Pain   Douglas Powell is a 42 y.o. male no significant past medical history presents with concern for a vein that seems to be enlarged on the anterior right shin.  He noticed it this morning.  States it is slightly tender to touch.  Denies any injuries to the leg.  Denies any calf pain or swelling. Denies any chest pain or shortness of breath. No leg numbness or discoloration.     Leg Pain      Prior to Admission medications   Medication Sig Start Date End Date Taking? Authorizing Provider  albuterol (PROVENTIL HFA;VENTOLIN HFA) 108 (90 Base) MCG/ACT inhaler Inhale 2 puffs into the lungs as needed.  05/15/18   [provider]  aspirin 325 MG tablet Take 325 mg by mouth daily.    [provider]  Budesonide  ER 6 MG CP24 ADD (ONE DOSE) OF MEDICATION TO 240ML OF SALINE IN SINUS RINSE BOTTLE. IRRIGATE SINUSES WITH THROUGH EACH NOSTRIL TWICE DAILY 01/14/20   [provider]  busPIRone  (BUSPAR ) 10 MG tablet Take 1 tablet (10 mg total) by mouth 3 (three) times daily as needed (anxiety). 11/01/22   Job Lukes, PA  fluticasone-salmeterol (ADVAIR HFA) 115-21 MCG/ACT inhaler Inhale 2 puffs into the lungs 2 (two) times daily.    [provider]  HYDROcodone -acetaminophen  (NORCO/VICODIN) 5-325 MG tablet Take 2 tablets by mouth every 4 (four) hours as needed. Patient not taking: Reported on 09/26/2021 09/25/21   Bernis Ernst, PA-C  pantoprazole  (PROTONIX ) 40 MG tablet Take 1 tablet by mouth daily. 02/10/20   [provider]  triamcinolone  cream (KENALOG ) 0.1 % Apply to affected area 1-2 times daily 06/27/21   Job Lukes, PA    Allergies: Aspirin    Review of Systems  Cardiovascular:  Negative for chest pain.  Skin:  Negative for wound.    Updated Vital Signs BP 119/82 (BP  Location: Right Arm)   Pulse 83   Temp 98 F (36.7 C) (Oral)   Resp 14   SpO2 100%   Physical Exam Vitals and nursing note reviewed.  Constitutional:      Appearance: Normal appearance.  HENT:     Head: Atraumatic.  Cardiovascular:     Rate and Rhythm: Normal rate and regular rhythm.     Comments: 1+ pedal pulses bilaterally Pulmonary:     Effort: Pulmonary effort is normal.  Musculoskeletal:     Comments: Right lower extremity:  General Slightly enlarged vein on the anterior right shin. Mildly tender over this vein. No obvious deformity. No erythema, edema, contusions, open wounds   Palpation Non-tender of the calves bilaterally Non tender over the femur, tibia and fibula, patella, popliteal fossa  ROM Full knee flexion and extension  Sensation: Sensation intact throughout the lower extremity  Strength: 5/5 strength with resisted ankle plantarflexion and dorsiflexion   Neurological:     General: No focal deficit present.     Mental Status: He is alert.  Psychiatric:        Mood and Affect: Mood normal.        Behavior: Behavior normal.     (all labs ordered are listed, but only abnormal results are displayed) Labs Reviewed  D-DIMER, QUANTITATIVE    EKG: None  Radiology: No results found.   Procedures   Medications Ordered in the ED -  No data to display                                  Medical Decision Making Amount and/or Complexity of Data Reviewed Labs: ordered.     Differential diagnosis includes but is not limited to DVT, thrombophlebitis, phlebitis, fracture  ED Course:  Upon initial evaluation, patient is well-appearing, stable vitals.  Reporting an enlarged vein on the anterior right shin.  This is able to be visualized on exam.  Vein mildly tender to palpation.  No overlying skin change such as wounds or erythema to suggest cellulitis.  No calf tenderness to palpation, no edema of the lower extremities.  Neurovascular intact in the  lower extremity.  His D-dimer was within normal limits.  I have low concern for DVT at this time. Suspect this may be phlebitis.  No concern for emergent pathology at this time.  Stable and appropriate for discharge home  Labs Ordered: I Ordered, and personally interpreted labs.  The pertinent results include:   D-dimer within normal limits  Impression: Right lower extremity phlebitis  Disposition:  The patient was discharged home with instructions to ice addition to help with pain and swelling.  May take ibuprofen as needed for pain with inflammation.  Follow-up with PCP if symptoms not improving within the next 2 weeks. Return precautions given.     This chart was dictated using voice recognition software, Dragon. Despite the best efforts of this provider to proofread and correct errors, errors may still occur which can change documentation meaning.       Final diagnoses:  Phlebitis    ED Discharge Orders     None          Veta Palma, PA-C 12/08/23 9056    Veta Palma, PA-C 12/08/23 0944    Veta Palma, PA-C 12/08/23 9053    Ula Prentice SAUNDERS, MD 12/08/23 [phone]

## 2023-12-08 NOTE — Discharge Instructions (Addendum)
 It appears you have inflammation of the vein in your leg.  You may ice the area to help with pain and swelling.  You may use up to 600mg  ibuprofen every 6 hours as needed for pain and to help with the inflammation.  Do not exceed 2.4g of ibuprofen per day.  Blood test to look for blood clot was normal, this does not seem to be a blood clot.  Please follow-up with your PCP if symptoms are not starting to improve within the next 2 weeks.  Return to the ER for any calf pain or swelling, chest pain or shortness of breath, any other new or concerning symptoms

## 2023-12-08 NOTE — ED Triage Notes (Signed)
 C/o anterior R leg pain. Denies injury. No discoloration. Equal temp.

## 2024-03-15 IMAGING — DX DG HAND COMPLETE 3+V*R*
3 series · 3 of 3 positions shown · non-contrast
Comparison: None Available.

CLINICAL DATA: Right hand pain after injury.

EXAM:
RIGHT HAND - COMPLETE 3+ VIEW

[hand ap]
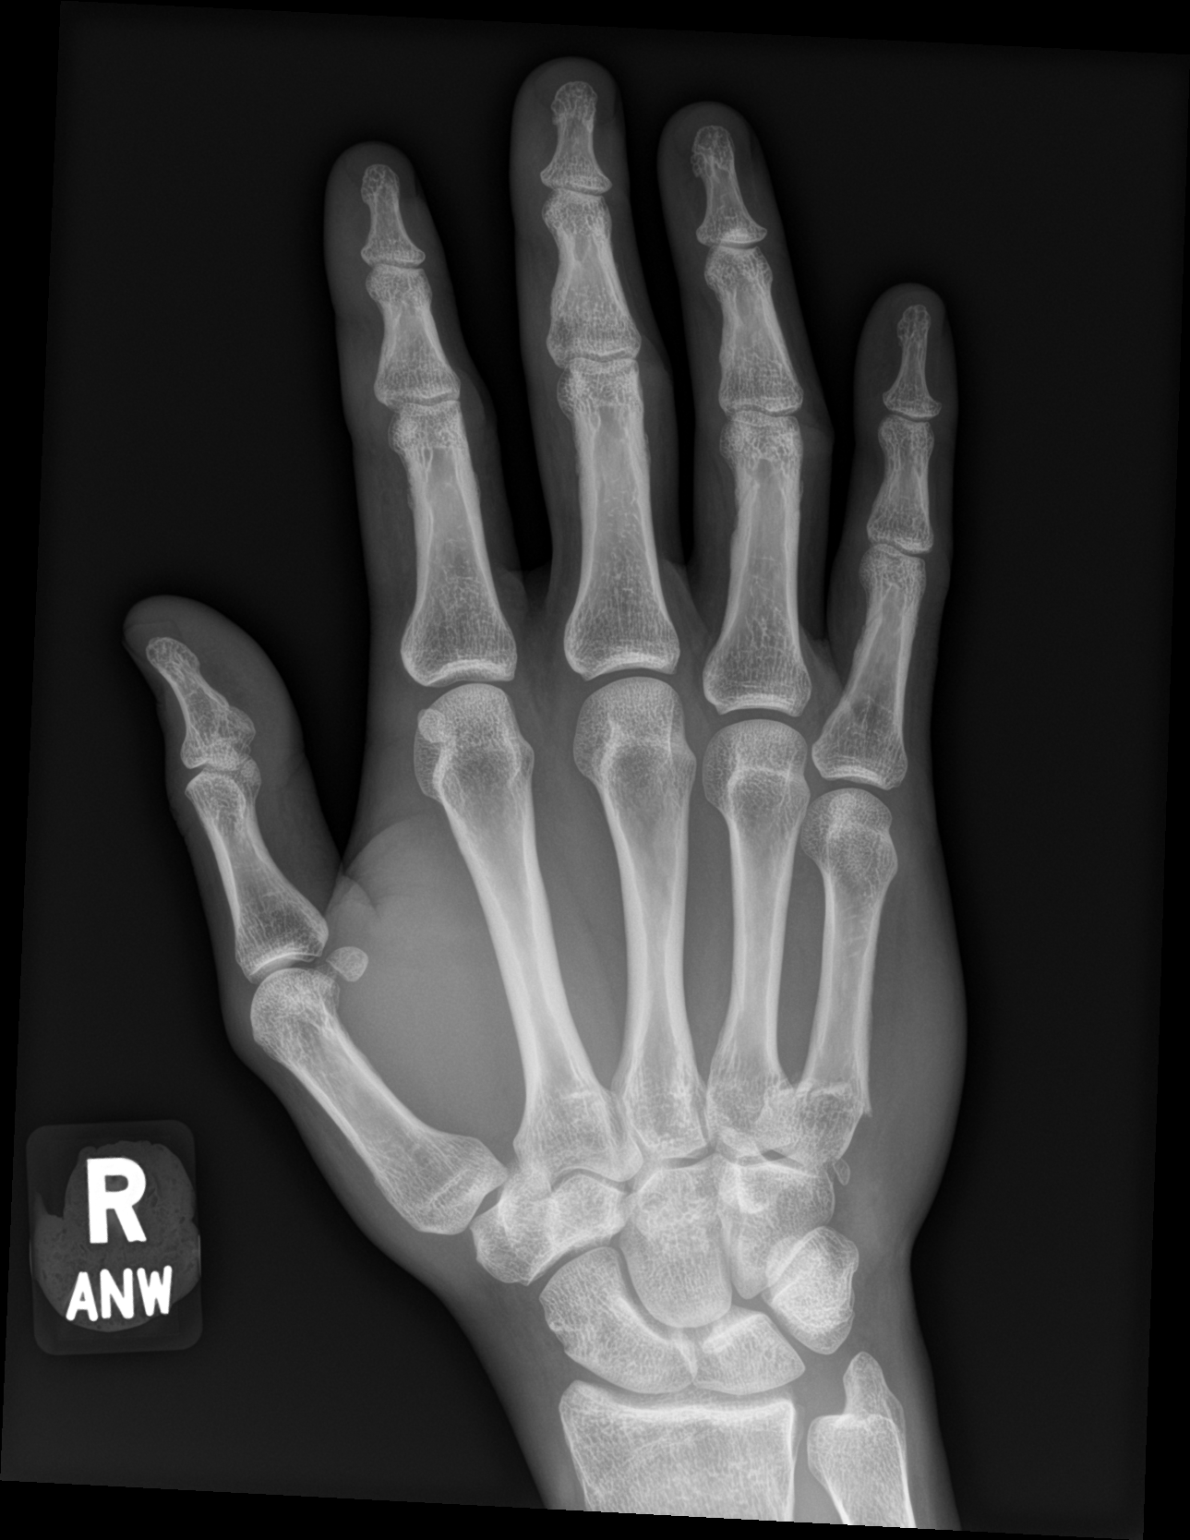

[hand obl]
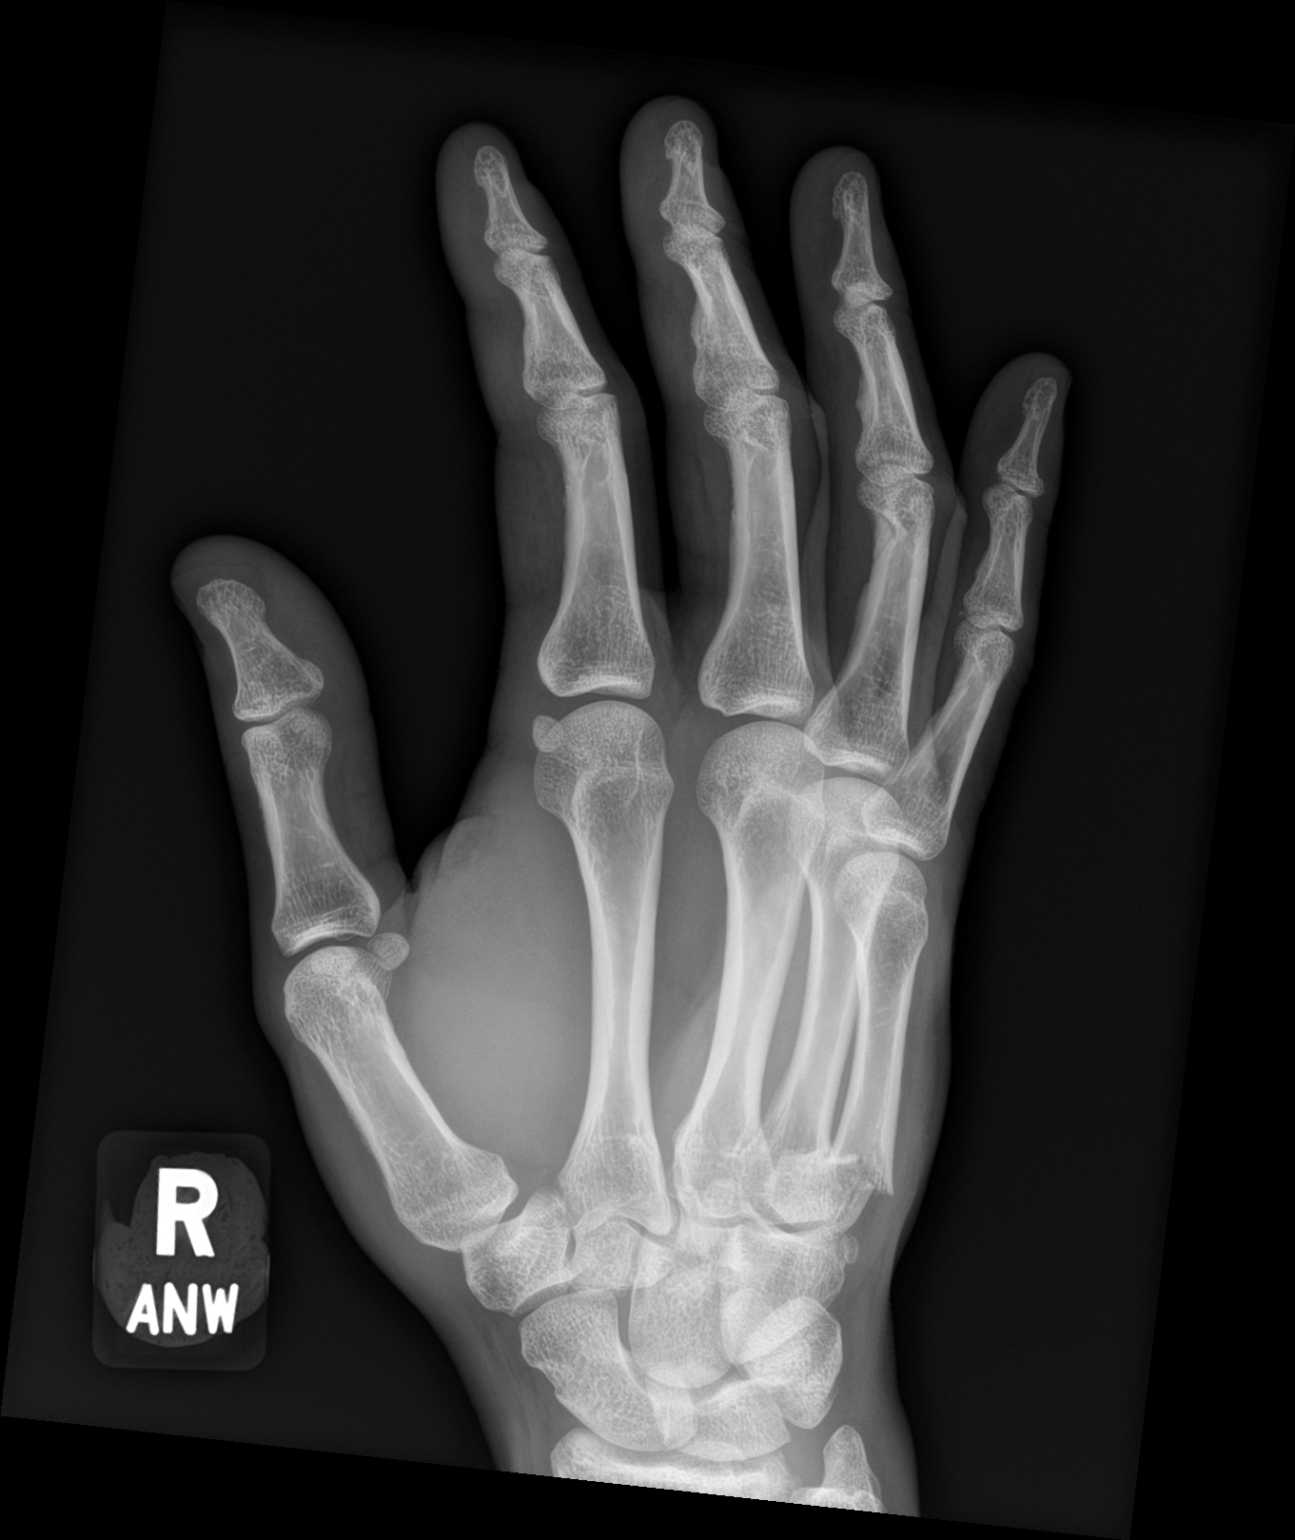

[hand lat]
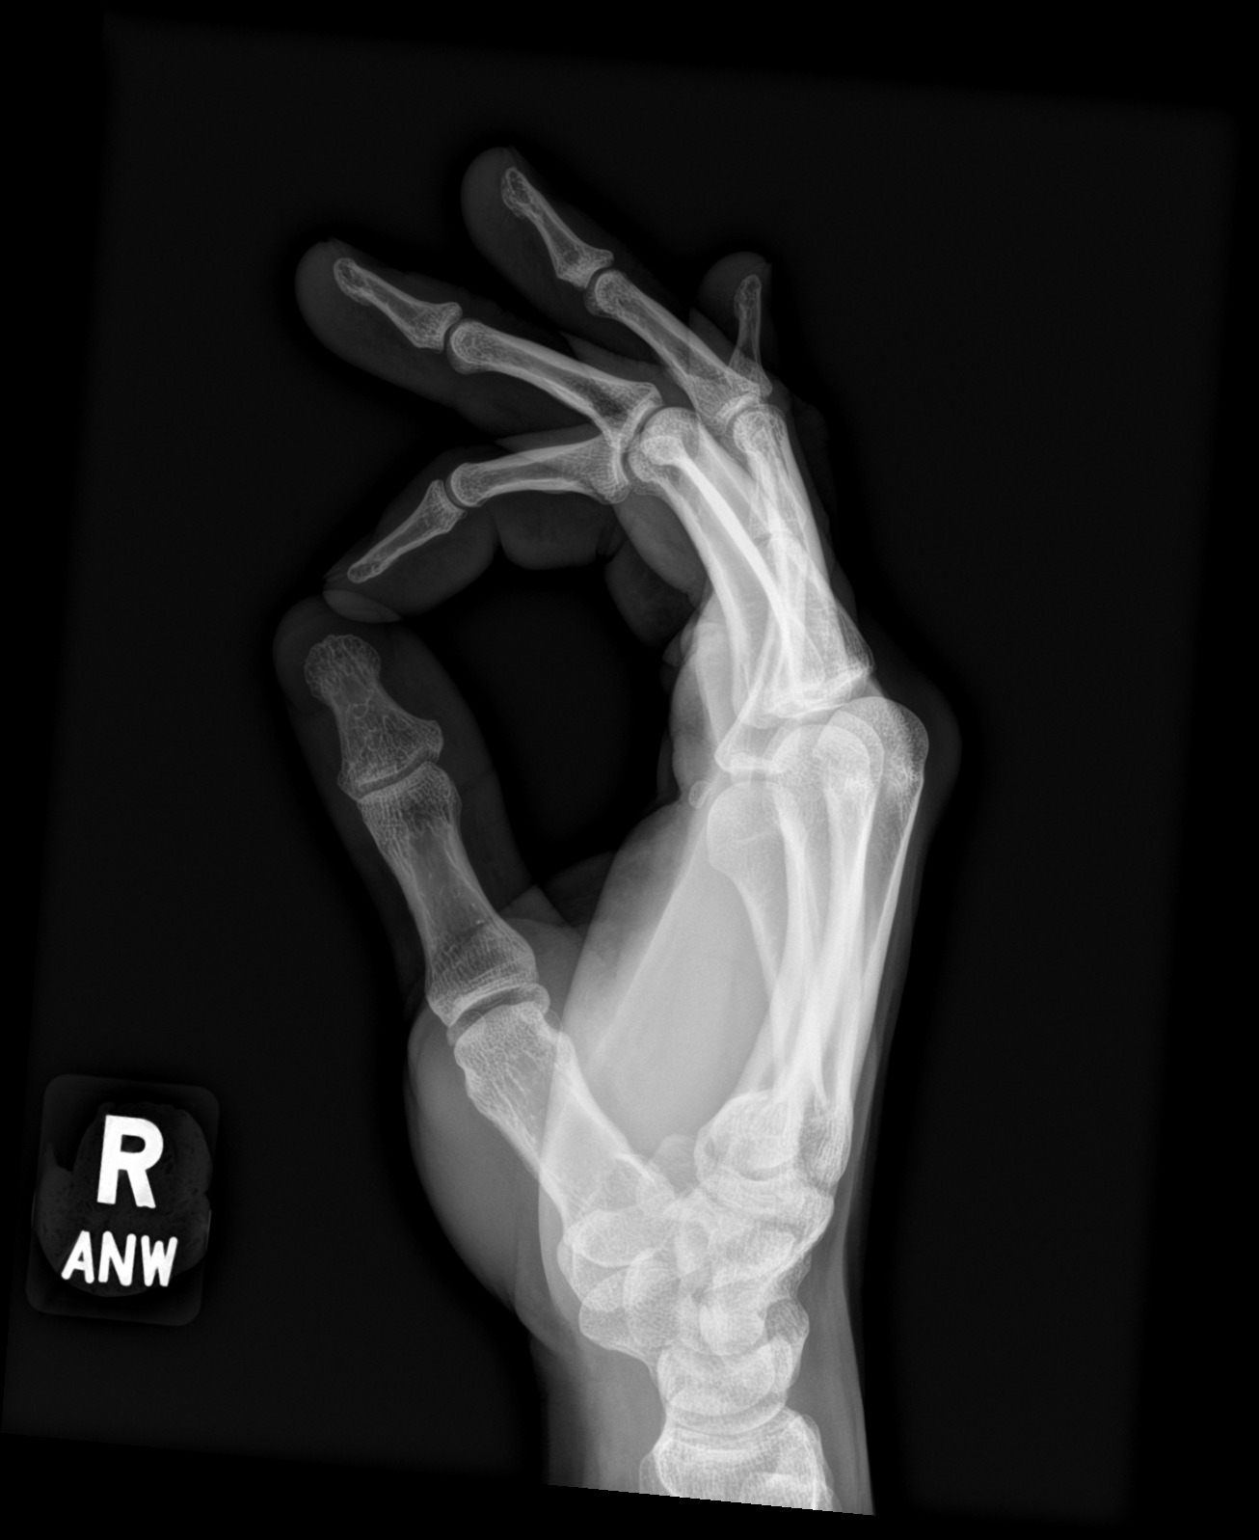

[3 of 3 positions shown; findings below may reference images not displayed]

FINDINGS: Moderately displaced proximal fifth metacarpal fracture is noted.
Joint spaces are intact.
IMPRESSION: Moderately displaced proximal fifth metacarpal fracture.

## 2024-03-25 ENCOUNTER — Encounter (INDEPENDENT_AMBULATORY_CARE_PROVIDER_SITE_OTHER): Payer: Self-pay | Admitting: Otolaryngology

## 2024-03-25 ENCOUNTER — Ambulatory Visit (INDEPENDENT_AMBULATORY_CARE_PROVIDER_SITE_OTHER): Admitting: Otolaryngology

## 2024-03-25 VITALS — BP 125/74 | HR 82 | Temp 98.0°F | Ht 71.0 in | Wt 175.0 lb

## 2024-03-25 DIAGNOSIS — J329 Chronic sinusitis, unspecified: Secondary | ICD-10-CM | POA: Diagnosis not present

## 2024-03-25 DIAGNOSIS — H6122 Impacted cerumen, left ear: Secondary | ICD-10-CM

## 2024-03-25 DIAGNOSIS — H65192 Other acute nonsuppurative otitis media, left ear: Secondary | ICD-10-CM | POA: Diagnosis not present

## 2024-03-25 DIAGNOSIS — J324 Chronic pansinusitis: Secondary | ICD-10-CM | POA: Insufficient documentation

## 2024-03-25 DIAGNOSIS — J3489 Other specified disorders of nose and nasal sinuses: Secondary | ICD-10-CM | POA: Diagnosis not present

## 2024-03-25 DIAGNOSIS — J338 Other polyp of sinus: Secondary | ICD-10-CM | POA: Insufficient documentation

## 2024-03-25 NOTE — Progress Notes (Signed)
 Patient ID: Douglas Powell, male   DOB: Oct 23, 1980, 43 y.o.   MRN: [id number]  Follow up: Chronic rhinosinusitis, sinonasal polyps New complaint: Left ear infection  Discussed the use of AI scribe software for clinical note transcription with the patient, who gave verbal consent to proceed.  History of Present Illness Douglas Powell is a 43 year old male with nasal polyps who presents with left ear pain and hearing loss.  He has been experiencing left ear pain and decreased hearing for the past two days. The left ear feels 'really clogged' and initially there was significant pain. He is currently on amoxicillin 500 mg twice a day for seven days and ofloxacin ear drops, ten drops once a day, prescribed by urgent care due to a recent fever and ear infection. The patient reports that the amoxicillin has reduced his fever and pain.  He has a history of nasal polyps and sinus infections, for which he uses budesonide  nasal rinse. He underwent sinus surgery approximately twenty years ago at [facility] [place] . His sinuses sometimes exacerbate ear problems, causing them to clog up. He has previously tried Dupixent for his polyps, which was ineffective.  Exam: General: Communicates without difficulty, well nourished, no acute distress. Head: Normocephalic, no evidence injury, no tenderness, facial buttresses intact without stepoff. Face/sinus: No tenderness to palpation and percussion. Facial movement is normal and symmetric. Eyes: PERRL, EOMI. No scleral icterus, conjunctivae clear. Neuro: CN II exam reveals vision grossly intact.  No nystagmus at any point of gaze. Ears: Auricles well formed without lesions.  Ear canals are intact without mass or lesion.  Left ear impacted by cerumen and squamous debris.  The right ear canal and tympanic membrane are normal.  Nose: External evaluation reveals normal support and skin without lesions.  Dorsum is intact.  Anterior rhinoscopy reveals congested mucosa over  anterior aspect of inferior turbinates and intact septum.  Small polypoid tissues noted bilaterally.  Oral:  Oral cavity and oropharynx are intact, symmetric, without erythema or edema.  Mucosa is moist without lesions. Neck: Full range of motion without pain.  There is no significant lymphadenopathy.  No masses palpable.  Thyroid bed within normal limits to palpation.  Parotid glands and submandibular glands equal bilaterally without mass.  Trachea is midline. Neuro:  CN 2-12 grossly intact.    Procedure: Left ear cerumen disimpaction Anesthesia: None Description: Under the operating microscope, the cerumen is carefully removed with a combination of cerumen currette, alligator forceps, and suction catheters.  After the cerumen is removed, the left tympanic membrane is noted to be edematous, with left middle ear effusion.  No mass, erythema, or lesions. The patient tolerated the procedure well.    Assessment and Plan Assessment & Plan Acute left otitis media with effusion Left ear pain and hearing loss for two days, with fever and significant pain. Examination reveals fluid in the middle ear space, likely due to eustachian tube dysfunction. No perforation of the eardrum. Amoxicillin has reduced fever and pain. - Complete amoxicillin course. - Discontinue ofloxacin ear drops unless drainage occurs. - Will consider tympanostomy tube placement if symptoms persist.  Chronic rhinosinusitis with nasal polyps Chronic nasal congestion and small nasal polyps. Previous sinus surgery 20 years ago. Dupixent was ineffective. Considering alternative biologics like Fasenra or Nucala, pending allergist approval due to cost and insurance requirements. - Continue budesonide  nasal irrigation. - Discuss alternative biologics with allergist.  Cerumen impaction, left ear - Otomicroscopy with left ear cerumen disimpaction.

## 2024-03-29 ENCOUNTER — Telehealth (INDEPENDENT_AMBULATORY_CARE_PROVIDER_SITE_OTHER): Payer: Self-pay | Admitting: Otolaryngology

## 2024-03-29 ENCOUNTER — Other Ambulatory Visit (INDEPENDENT_AMBULATORY_CARE_PROVIDER_SITE_OTHER): Payer: Self-pay | Admitting: Otolaryngology

## 2024-03-29 DIAGNOSIS — J338 Other polyp of sinus: Secondary | ICD-10-CM

## 2024-03-29 DIAGNOSIS — J324 Chronic pansinusitis: Secondary | ICD-10-CM

## 2024-03-29 NOTE — Telephone Encounter (Signed)
 Patient called and stated that he still has sinus pain and pressure. I told him that Dr. Karis will order a sinus CT. He agreed and understood.

## 2024-03-29 NOTE — Telephone Encounter (Signed)
 The patient called in reporting that he has taken 5 days of the medication and his ear is getting worse and his hearing is decreased.  If we want to send in medication his pharmacy is [facility] in [place]. Please advise patient on next steps. Thanks!

## 2024-04-21 ENCOUNTER — Ambulatory Visit (INDEPENDENT_AMBULATORY_CARE_PROVIDER_SITE_OTHER): Admitting: Otolaryngology
